# Patient Record
Sex: Male | Born: 1960 | Race: White | Hispanic: No | Marital: Married | Smoking: Former smoker
Health system: Southern US, Community
[De-identification: ages and names within clinical notes are randomized; demographics above are authoritative.]

## PROBLEM LIST (undated history)

## (undated) DIAGNOSIS — F419 Anxiety disorder, unspecified: Secondary | ICD-10-CM

## (undated) DIAGNOSIS — Z9109 Other allergy status, other than to drugs and biological substances: Secondary | ICD-10-CM

## (undated) DIAGNOSIS — E785 Hyperlipidemia, unspecified: Secondary | ICD-10-CM

## (undated) DIAGNOSIS — J45909 Unspecified asthma, uncomplicated: Secondary | ICD-10-CM

## (undated) DIAGNOSIS — M4310 Spondylolisthesis, site unspecified: Secondary | ICD-10-CM

## (undated) HISTORY — PX: FRACTURE SURGERY: SHX138

## (undated) HISTORY — DX: Hyperlipidemia, unspecified: E78.5

## (undated) HISTORY — PX: OTHER SURGICAL HISTORY: SHX169

## (undated) HISTORY — DX: Unspecified asthma, uncomplicated: J45.909

## (undated) HISTORY — PX: COLONOSCOPY: SHX174

## (undated) HISTORY — PX: WRIST SURGERY: SHX841

## (undated) HISTORY — DX: Spondylolisthesis, site unspecified: M43.10

---

## 2009-01-20 ENCOUNTER — Encounter: Admission: RE | Admit: 2009-01-20 | Discharge: 2009-01-20 | Payer: Self-pay | Admitting: Family Medicine

## 2009-03-22 ENCOUNTER — Emergency Department (HOSPITAL_COMMUNITY): Admission: EM | Admit: 2009-03-22 | Discharge: 2009-03-22 | Payer: Self-pay | Admitting: Emergency Medicine

## 2012-11-01 ENCOUNTER — Emergency Department (HOSPITAL_COMMUNITY)
Admission: EM | Admit: 2012-11-01 | Discharge: 2012-11-01 | Disposition: A | Discharge status: Home / Self Care | Payer: BC Managed Care – PPO | Attending: Emergency Medicine | Admitting: Emergency Medicine

## 2012-11-01 ENCOUNTER — Encounter (HOSPITAL_COMMUNITY): Payer: Self-pay | Admitting: Emergency Medicine

## 2012-11-01 ENCOUNTER — Emergency Department (HOSPITAL_COMMUNITY): Payer: BC Managed Care – PPO

## 2012-11-01 DIAGNOSIS — J101 Influenza due to other identified influenza virus with other respiratory manifestations: Secondary | ICD-10-CM

## 2012-11-01 DIAGNOSIS — J111 Influenza due to unidentified influenza virus with other respiratory manifestations: Secondary | ICD-10-CM | POA: Insufficient documentation

## 2012-11-01 DIAGNOSIS — Z87891 Personal history of nicotine dependence: Secondary | ICD-10-CM | POA: Insufficient documentation

## 2012-11-01 DIAGNOSIS — Z9109 Other allergy status, other than to drugs and biological substances: Secondary | ICD-10-CM | POA: Insufficient documentation

## 2012-11-01 HISTORY — DX: Other allergy status, other than to drugs and biological substances: Z91.09

## 2012-11-01 LAB — BASIC METABOLIC PANEL
Calcium: 9.2 mg/dL (ref 8.4–10.5)
GFR calc Af Amer: >90 mL/min (ref >90)
GFR calc non Af Amer: >90 mL/min (ref >90)
Potassium: 4.7 mEq/L (ref 3.5–5.1)
Sodium: 136 mEq/L (ref 135–145)

## 2012-11-01 LAB — LACTIC ACID, PLASMA: Lactic Acid, Venous: 1.8 mmol/L (ref 0.5–2.2)

## 2012-11-01 MED ORDER — ALBUTEROL SULFATE (5 MG/ML) 0.5% IN NEBU
5.0000 mg | INHALATION_SOLUTION | Freq: Once | RESPIRATORY_TRACT | Status: AC
  Administered 2012-11-01: 5 mg via RESPIRATORY_TRACT
  Filled 2012-11-01: qty 1

## 2012-11-01 MED ORDER — ALBUTEROL SULFATE HFA 108 (90 BASE) MCG/ACT IN AERS
2.0000 | INHALATION_SPRAY | RESPIRATORY_TRACT | Status: DC | PRN
  Filled 2012-11-01: qty 6.7

## 2012-11-01 MED ORDER — SODIUM CHLORIDE 0.9 % IV BOLUS (SEPSIS)
1000.0000 mL | Freq: Once | INTRAVENOUS | Status: AC
  Administered 2012-11-01: 1000 mL via INTRAVENOUS

## 2012-11-01 MED ORDER — ACETAMINOPHEN 325 MG PO TABS
650.0000 mg | ORAL_TABLET | Freq: Once | ORAL | Status: AC
  Administered 2012-11-01: 650 mg via ORAL
  Filled 2012-11-01: qty 2

## 2012-11-01 MED ORDER — METHYLPREDNISOLONE SODIUM SUCC 125 MG IJ SOLR
125.0000 mg | Freq: Once | INTRAMUSCULAR | Status: AC
  Administered 2012-11-01: 125 mg via INTRAVENOUS
  Filled 2012-11-01: qty 2

## 2012-11-01 MED ORDER — OSELTAMIVIR PHOSPHATE 75 MG PO CAPS: 75.0000 mg | ORAL_CAPSULE | Freq: Two times a day (BID) | ORAL | Status: DC

## 2012-11-01 MED ORDER — OSELTAMIVIR PHOSPHATE 75 MG PO CAPS
75.0000 mg | ORAL_CAPSULE | Freq: Once | ORAL | Status: AC
  Administered 2012-11-01: 75 mg via ORAL
  Filled 2012-11-01: qty 1

## 2012-11-01 MED ORDER — PREDNISONE 10 MG PO TABS: 20.0000 mg | ORAL_TABLET | Freq: Every day | ORAL | Status: DC

## 2012-11-01 NOTE — ED Notes (Signed)
Per EMS pt came from [place] [facility] where pt was being seen for ShOB, vomiting, shivers, diaphoretic.  Pt was 100% on RA but pt felt more comfortable with NRB on.  Pt was given DuoNeb and 12.5mg  phenergan at PCP office.

## 2012-11-01 NOTE — ED Provider Notes (Signed)
History     CSN: [id number]  Arrival date & time 11/01/12  0927   First MD Initiated Contact with Patient 11/01/12 0930      Chief Complaint  Patient presents with  . Shortness of Breath  . Fever  . Emesis    (Consider location/radiation/quality/duration/timing/severity/associated sxs/prior treatment) HPI Patient with cough, fever, dyspnea began Monday.  Patient with 34 year pack history of smoking quit smoking July 2012.  Vomited x 3.  Patient seen in pmd office this am by Dr. Luciana Axe and sent to ed.  Patient denies nasal congestion , sore throat, no joint pain.  States headache 4/10, some back pain, cough with cough drop colored sputum.  Using inhaler six times per day.  Nothing to eat or drink since 6 p.m. Yesterday.  No history of medical hospitalizations.  Subjective fever at home and chills.  Wbc 13.1 at mds office.  Past Medical History  Diagnosis Date  . Pollen allergies     Past Surgical History  Procedure Date  . Wrist surgery   . Fracture surgery   . Right index finger fracture     No family history on file.  History  Substance Use Topics  . Smoking status: Former Smoker    Quit date: 03/28/2011  . Smokeless tobacco: Not on file  . Alcohol Use: No      Review of Systems  All other systems reviewed and are negative.    Allergies  Review of patient's allergies indicates no known allergies.  Home Medications  No current outpatient prescriptions on file.  BP 126/61  Pulse 105  Temp 102.1 F (38.9 C) (Oral)  Resp 23  SpO2 100%  Physical Exam  Nursing note and vitals reviewed. Constitutional: He is oriented to person, place, and time. He appears well-developed and well-nourished.       warm  HENT:  Head: Normocephalic and atraumatic.  Right Ear: External ear normal.  Left Ear: External ear normal.  Nose: Nose normal.  Mouth/Throat: Oropharynx is clear and moist.  Eyes: Conjunctivae normal and EOM are normal. Pupils are equal, round, and  reactive to light.  Neck: Normal range of motion. Neck supple.  Cardiovascular: Regular rhythm, normal heart sounds and intact distal pulses.  Tachycardia present.   Pulmonary/Chest: Effort normal and breath sounds normal.  Abdominal: Soft. Bowel sounds are normal.  Musculoskeletal: Normal range of motion.  Neurological: He is alert and oriented to person, place, and time. He has normal reflexes.  Skin: Skin is warm and dry.  Psychiatric: He has a normal mood and affect. His behavior is normal. Thought content normal.    ED Course  Procedures (including critical care time)  Labs Reviewed - No data to display No results found.   No diagnosis found.  Dr. Ephriam Knuckles office called and report that he is positive for influenza a.    MDM  Patient hr down to 90s , sats 99% on room air.  He had one liter of ns and is drinking fluids without difficulty  He has had one neb here and has no wheezing.  He had tamiflu ordered.  He will be given rx for tamiflu, albuterol with aerochamber, and prednisone.  Patient advised return if worse, symptomatic treatment including oral hydration, and work note given until 2/10.        Hilario Quarry, MD 11/01/12 1115

## 2012-11-01 NOTE — ED Notes (Signed)
[id number]<NG> Expected date:<BR> Expected time:<BR> Means of arrival:<BR> Comments:<BR> 52 y/o M SOB

## 2012-12-18 ENCOUNTER — Other Ambulatory Visit: Payer: Self-pay | Admitting: Family Medicine

## 2012-12-18 DIAGNOSIS — R609 Edema, unspecified: Secondary | ICD-10-CM

## 2012-12-22 ENCOUNTER — Ambulatory Visit
Admission: RE | Admit: 2012-12-22 | Discharge: 2012-12-22 | Disposition: A | Discharge status: Home / Self Care | Payer: BC Managed Care – PPO | Source: Ambulatory Visit | Attending: Family Medicine | Admitting: Family Medicine

## 2012-12-22 DIAGNOSIS — R609 Edema, unspecified: Secondary | ICD-10-CM

## 2013-05-21 ENCOUNTER — Other Ambulatory Visit: Payer: Self-pay | Admitting: Family Medicine

## 2013-05-21 DIAGNOSIS — M549 Dorsalgia, unspecified: Secondary | ICD-10-CM

## 2013-05-30 ENCOUNTER — Other Ambulatory Visit: Payer: Self-pay | Admitting: Family Medicine

## 2013-05-30 DIAGNOSIS — M549 Dorsalgia, unspecified: Secondary | ICD-10-CM

## 2013-06-01 ENCOUNTER — Ambulatory Visit
Admission: RE | Admit: 2013-06-01 | Discharge: 2013-06-01 | Disposition: A | Discharge status: Home / Self Care | Payer: BC Managed Care – PPO | Source: Ambulatory Visit | Attending: Family Medicine | Admitting: Family Medicine

## 2013-06-01 DIAGNOSIS — M549 Dorsalgia, unspecified: Secondary | ICD-10-CM

## 2013-06-03 ENCOUNTER — Ambulatory Visit
Admission: RE | Admit: 2013-06-03 | Discharge: 2013-06-03 | Disposition: A | Discharge status: Home / Self Care | Payer: BC Managed Care – PPO | Source: Ambulatory Visit | Attending: Family Medicine | Admitting: Family Medicine

## 2013-06-03 DIAGNOSIS — M549 Dorsalgia, unspecified: Secondary | ICD-10-CM

## 2013-07-25 ENCOUNTER — Other Ambulatory Visit: Payer: Self-pay | Admitting: Neurosurgery

## 2013-07-25 DIAGNOSIS — M5412 Radiculopathy, cervical region: Secondary | ICD-10-CM

## 2013-08-05 ENCOUNTER — Ambulatory Visit
Admission: RE | Admit: 2013-08-05 | Discharge: 2013-08-05 | Disposition: A | Discharge status: Home / Self Care | Payer: BC Managed Care – PPO | Source: Ambulatory Visit | Attending: Neurosurgery | Admitting: Neurosurgery

## 2013-08-05 DIAGNOSIS — M5412 Radiculopathy, cervical region: Secondary | ICD-10-CM

## 2014-01-18 IMAGING — CR DG CHEST 2V
2 series · 2 of 2 positions shown · non-contrast
Comparison: CT 01/20/2009

CLINICAL DATA: Cough and fever

CHEST - 2 VIEW

[w chest pa]
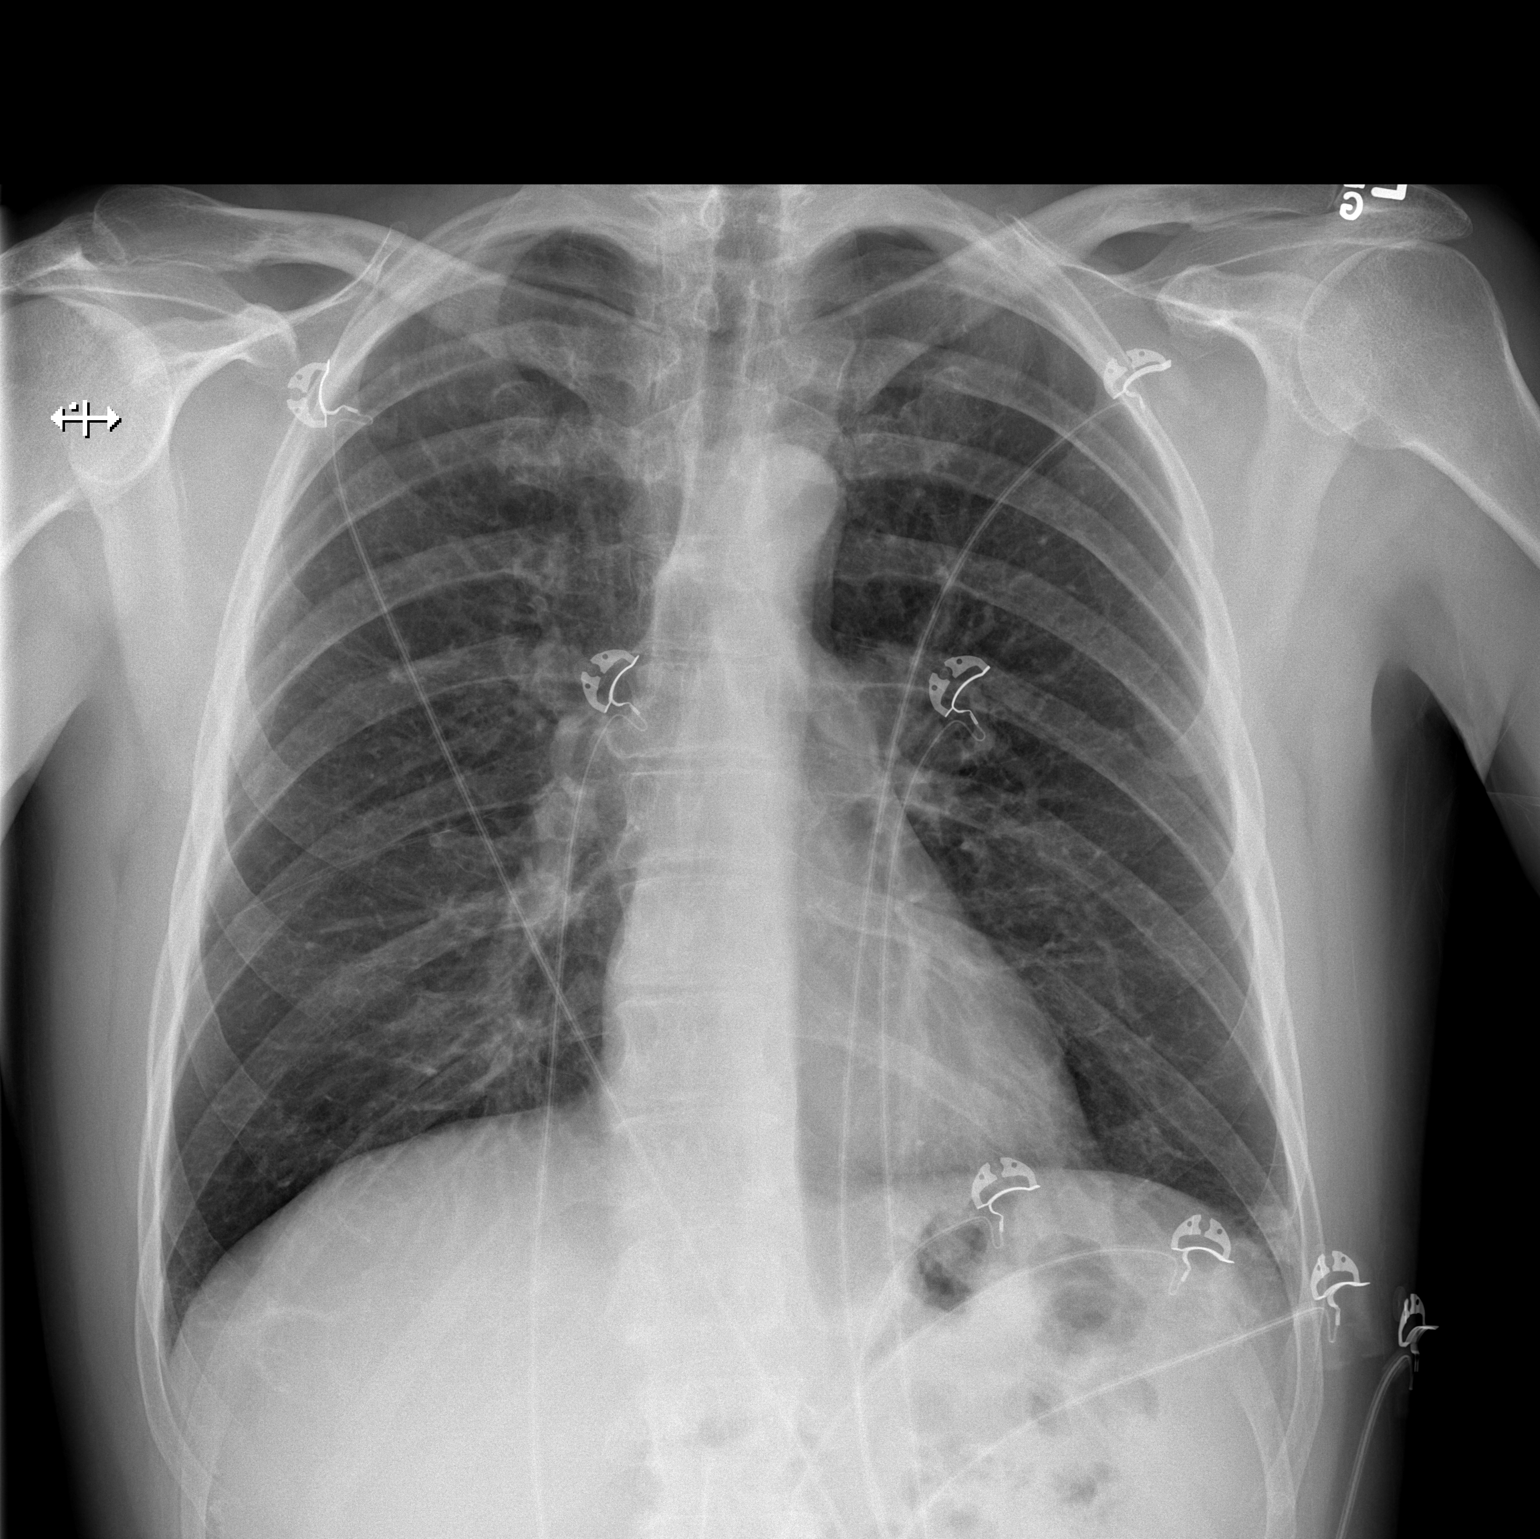

[w chest lat]
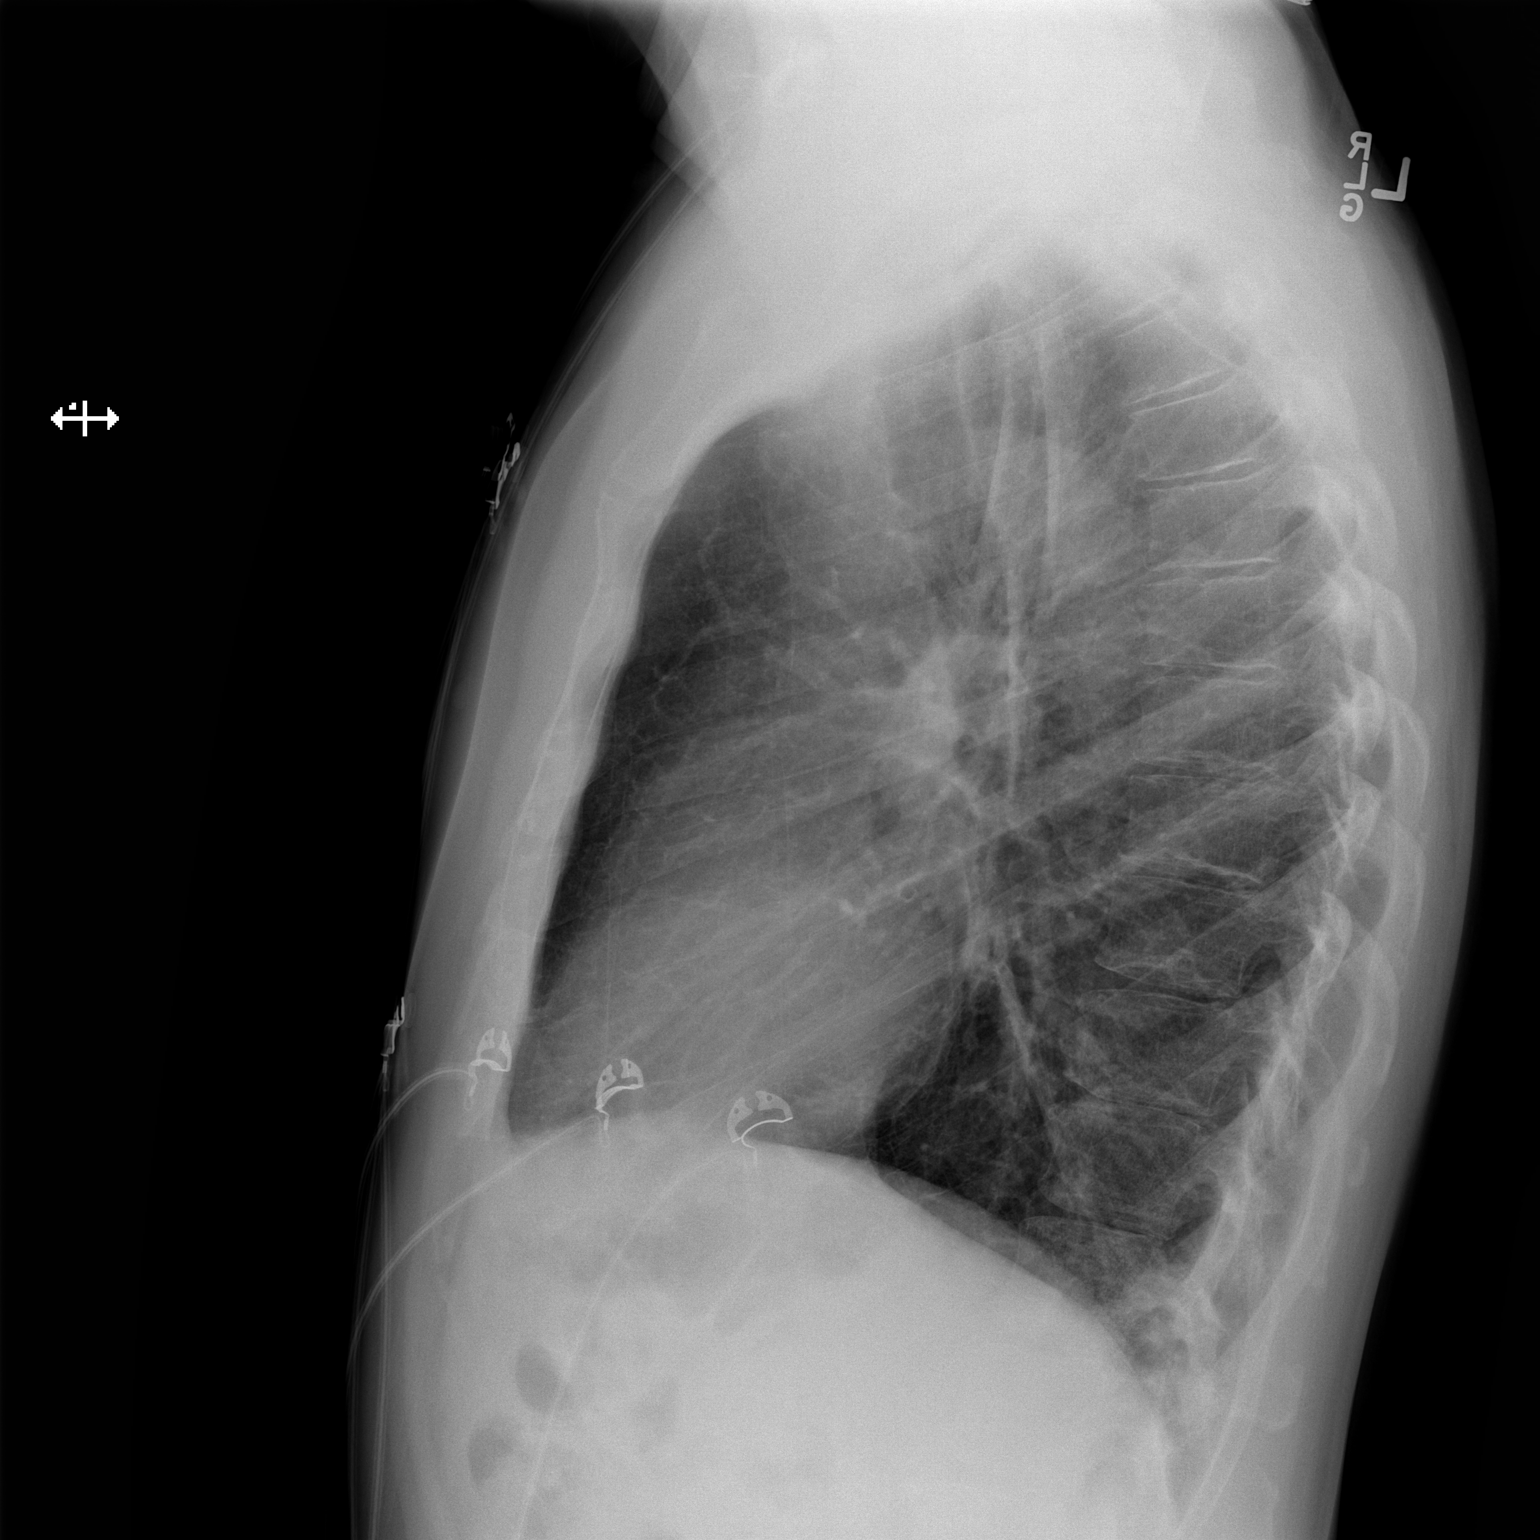

[2 of 2 positions shown; findings below may reference images not displayed]

FINDINGS: Heart size and vascularity are normal.  Lungs are clear
without infiltrate or effusion.  Negative for heart failure or
pneumonia.  No mass or adenopathy.
IMPRESSION: No acute abnormality.

## 2014-12-31 ENCOUNTER — Other Ambulatory Visit: Payer: Self-pay | Admitting: Neurosurgery

## 2014-12-31 DIAGNOSIS — M4310 Spondylolisthesis, site unspecified: Secondary | ICD-10-CM

## 2015-01-06 ENCOUNTER — Ambulatory Visit
Admission: RE | Admit: 2015-01-06 | Discharge: 2015-01-06 | Disposition: A | Discharge status: Home / Self Care | Payer: BLUE CROSS/BLUE SHIELD | Source: Ambulatory Visit | Attending: Neurosurgery | Admitting: Neurosurgery

## 2015-01-06 DIAGNOSIS — M4310 Spondylolisthesis, site unspecified: Secondary | ICD-10-CM

## 2015-01-10 ENCOUNTER — Other Ambulatory Visit: Payer: Self-pay | Admitting: Neurosurgery

## 2015-01-21 ENCOUNTER — Other Ambulatory Visit: Payer: Self-pay

## 2015-01-22 ENCOUNTER — Encounter (HOSPITAL_COMMUNITY): Payer: Self-pay

## 2015-01-22 ENCOUNTER — Encounter (HOSPITAL_COMMUNITY)
Admission: RE | Admit: 2015-01-22 | Discharge: 2015-01-22 | Disposition: A | Discharge status: Home / Self Care | Payer: BLUE CROSS/BLUE SHIELD | Source: Ambulatory Visit | Attending: Neurosurgery | Admitting: Neurosurgery

## 2015-01-22 HISTORY — DX: Anxiety disorder, unspecified: F41.9

## 2015-01-22 LAB — SURGICAL PCR SCREEN
MRSA, PCR: NEGATIVE
STAPHYLOCOCCUS AUREUS: POSITIVE — AB

## 2015-01-22 LAB — CBC
HCT: 40.3 % (ref 39.0–52.0)
HEMOGLOBIN: 13.4 g/dL (ref 13.0–17.0)
MCH: 29.1 pg (ref 26.0–34.0)
MCHC: 33.3 g/dL (ref 30.0–36.0)
MCV: 87.4 fL (ref 78.0–100.0)
PLATELETS: 240 10*3/uL (ref 150–400)
RBC: 4.61 MIL/uL (ref 4.22–5.81)
RDW: 12.7 % (ref 11.5–15.5)
WBC: 7.5 10*3/uL (ref 4.0–10.5)

## 2015-01-22 NOTE — Progress Notes (Signed)
Nurse [facility], and Nurse called patient and informed him of positive PCR results.

## 2015-01-22 NOTE — Progress Notes (Signed)
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 Therefore T&S will be collected DOS.

## 2015-01-22 NOTE — Pre-Procedure Instructions (Signed)
Christopher CapriceGregory A Horn  01/22/2015   Your procedure is scheduled on:  Friday Jan 31, 2015 at 9:57 AM.  [facility] 7:00 AM.  Call this number if you have problems the morning of surgery: [phone]   Remember:   Do not eat food or drink liquids after midnight.   Take these medicines the morning of surgery with A SIP OF WATER: Pain medication   Do not wear jewelry.  Do not wear lotions, powders, or cologne.   Men may shave face and neck.  Do not bring valuables to the hospital.  [facility] is not responsible for any belongings or valuables.               Contacts, dentures or bridgework may not be worn into surgery.  Leave suitcase in the car. After surgery it may be brought to your room.  For patients admitted to the hospital, discharge time is determined by your treatment team.               Patients discharged the day of surgery will not be allowed to drive home.  Name and phone number of your driver:   Special Instructions: Shower using CHG soap the night before and the morning of your surgery   Please read over the following fact sheets that you were given: Pain Booklet, Coughing and Deep Breathing, Blood Transfusion Information, MRSA Information and Surgical Site Infection Prevention

## 2015-01-26 HISTORY — PX: BACK SURGERY: SHX140

## 2015-01-30 MED ORDER — CEFAZOLIN SODIUM-DEXTROSE 2-3 GM-% IV SOLR
2.0000 g | INTRAVENOUS | Status: AC
  Administered 2015-01-31: 2 g via INTRAVENOUS
  Filled 2015-01-30: qty 50

## 2015-01-30 NOTE — H&P (Signed)
Christopher CapriceGregory A Horn is an 54 y.o. male.   Chief Complaint: right leg pain HPI: patient complaining of lumbar pain with radiation to the right foot associated with sensory changes ,unable to sleep and worse now because he is working at two different places. On top of the he also has neck pain with radiation to the left foot. Lately the numbnes is in both feet.   Past Medical History  Diagnosis Date  . Pollen allergies   . Anxiety     Past Surgical History  Procedure Laterality Date  . Wrist surgery    . Fracture surgery    . Right index finger fracture    . Colonoscopy      No family history on file. Social History:  reports that he quit smoking about 3 years ago. He does not have any smokeless tobacco history on file. He reports that he drinks alcohol. He reports that he does not use illicit drugs.  Allergies: No Known Allergies  No prescriptions prior to admission    No results found for this or any previous visit (from the past 48 hour(s)). No results found.  Review of Systems  Constitutional: Negative.   HENT: Negative.   Eyes: Negative.   Respiratory: Negative.   Cardiovascular: Negative.   Gastrointestinal: Negative.   Genitourinary: Negative.   Musculoskeletal: Positive for back pain.  Skin: Negative.   Neurological: Positive for focal weakness.  Endo/Heme/Allergies: Negative.   Psychiatric/Behavioral: Negative.     There were no vitals taken for this visit. Physical Exam hent, nl. Neck, pain with lateralization. Cv, nl. Lungs,clear. Abdomen, soft. Extremities, nl. NEURO decrease of sensation in both s1 dermatomes. Weakness of DF both feet. SLR positive at 60 degrees. Difficulties walking in both tiptoes and heels. Mri shows grade 2 spondylolisthesis at l5-s1  Assessment/Plan Patient to go ahead wih decompression and fusion at l5s1 with cages and screws. He is aware of risks and benefits. Late will do a w/u of the cervical spine  Christopher Horn 01/30/2015, 6:06  PM

## 2015-01-31 ENCOUNTER — Inpatient Hospital Stay (HOSPITAL_COMMUNITY): Payer: BLUE CROSS/BLUE SHIELD

## 2015-01-31 ENCOUNTER — Inpatient Hospital Stay (HOSPITAL_COMMUNITY): Payer: BLUE CROSS/BLUE SHIELD | Admitting: Anesthesiology

## 2015-01-31 ENCOUNTER — Encounter (HOSPITAL_COMMUNITY)
Admission: RE | Disposition: A | Discharge status: Home / Self Care | Payer: BLUE CROSS/BLUE SHIELD | Source: Ambulatory Visit | Attending: Neurosurgery

## 2015-01-31 ENCOUNTER — Encounter (HOSPITAL_COMMUNITY): Payer: Self-pay | Admitting: Anesthesiology

## 2015-01-31 ENCOUNTER — Inpatient Hospital Stay (HOSPITAL_COMMUNITY)
Admission: RE | Admit: 2015-01-31 | Discharge: 2015-02-04 | DRG: 460 | Disposition: A | Discharge status: Home / Self Care | Payer: BLUE CROSS/BLUE SHIELD | Source: Ambulatory Visit | Attending: Neurosurgery | Admitting: Neurosurgery

## 2015-01-31 DIAGNOSIS — M4316 Spondylolisthesis, lumbar region: Secondary | ICD-10-CM | POA: Diagnosis present

## 2015-01-31 DIAGNOSIS — K5900 Constipation, unspecified: Secondary | ICD-10-CM | POA: Diagnosis not present

## 2015-01-31 DIAGNOSIS — M5416 Radiculopathy, lumbar region: Secondary | ICD-10-CM | POA: Diagnosis present

## 2015-01-31 DIAGNOSIS — Z419 Encounter for procedure for purposes other than remedying health state, unspecified: Secondary | ICD-10-CM

## 2015-01-31 DIAGNOSIS — Z87891 Personal history of nicotine dependence: Secondary | ICD-10-CM

## 2015-01-31 DIAGNOSIS — Z01812 Encounter for preprocedural laboratory examination: Secondary | ICD-10-CM | POA: Diagnosis not present

## 2015-01-31 LAB — TYPE AND SCREEN
ABO/RH(D): O NEG
Antibody Screen: NEGATIVE

## 2015-01-31 LAB — ABO/RH: ABO/RH(D): O NEG

## 2015-01-31 SURGERY — POSTERIOR LUMBAR FUSION 1 LEVEL
Anesthesia: General

## 2015-01-31 MED ORDER — ONDANSETRON HCL 4 MG/2ML IJ SOLN
INTRAMUSCULAR | Status: DC | PRN
  Administered 2015-01-31: 4 mg via INTRAVENOUS

## 2015-01-31 MED ORDER — PROPOFOL 10 MG/ML IV BOLUS
INTRAVENOUS | Status: DC | PRN
  Administered 2015-01-31: 260 mg via INTRAVENOUS

## 2015-01-31 MED ORDER — PROPOFOL 10 MG/ML IV BOLUS
INTRAVENOUS | Status: AC
  Filled 2015-01-31: qty 20

## 2015-01-31 MED ORDER — DIAZEPAM 5 MG PO TABS
5.0000 mg | ORAL_TABLET | Freq: Four times a day (QID) | ORAL | Status: DC | PRN
  Administered 2015-02-03 (×2): 5 mg via ORAL
  Filled 2015-01-31 (×2): qty 1

## 2015-01-31 MED ORDER — ROCURONIUM BROMIDE 100 MG/10ML IV SOLN
INTRAVENOUS | Status: DC | PRN
  Administered 2015-01-31: 50 mg via INTRAVENOUS

## 2015-01-31 MED ORDER — ONDANSETRON HCL 4 MG/2ML IJ SOLN: 4.0000 mg | Freq: Four times a day (QID) | INTRAMUSCULAR | Status: DC | PRN

## 2015-01-31 MED ORDER — FENTANYL CITRATE (PF) 100 MCG/2ML IJ SOLN
INTRAMUSCULAR | Status: DC | PRN
  Administered 2015-01-31 (×2): 100 ug via INTRAVENOUS
  Administered 2015-01-31: 50 ug via INTRAVENOUS

## 2015-01-31 MED ORDER — VANCOMYCIN HCL 1000 MG IV SOLR
INTRAVENOUS | Status: AC
  Filled 2015-01-31: qty 1000

## 2015-01-31 MED ORDER — MIDAZOLAM HCL 2 MG/2ML IJ SOLN
INTRAMUSCULAR | Status: AC
  Filled 2015-01-31: qty 2

## 2015-01-31 MED ORDER — MENTHOL 3 MG MT LOZG: 1.0000 | LOZENGE | OROMUCOSAL | Status: DC | PRN

## 2015-01-31 MED ORDER — ROCURONIUM BROMIDE 50 MG/5ML IV SOLN
INTRAVENOUS | Status: AC
  Filled 2015-01-31: qty 1

## 2015-01-31 MED ORDER — ONDANSETRON HCL 4 MG/2ML IJ SOLN
INTRAMUSCULAR | Status: AC
  Filled 2015-01-31: qty 2

## 2015-01-31 MED ORDER — ONDANSETRON HCL 4 MG/2ML IJ SOLN
4.0000 mg | INTRAMUSCULAR | Status: DC | PRN
  Administered 2015-02-01 – 2015-02-04 (×3): 4 mg via INTRAVENOUS
  Filled 2015-01-31 (×3): qty 2

## 2015-01-31 MED ORDER — DIPHENHYDRAMINE HCL 12.5 MG/5ML PO ELIX: 12.5000 mg | ORAL_SOLUTION | Freq: Four times a day (QID) | ORAL | Status: DC | PRN

## 2015-01-31 MED ORDER — LACTATED RINGERS IV SOLN
INTRAVENOUS | Status: DC | PRN
  Administered 2015-01-31 (×2): via INTRAVENOUS

## 2015-01-31 MED ORDER — SODIUM CHLORIDE 0.9 % IJ SOLN: 9.0000 mL | INTRAMUSCULAR | Status: DC | PRN

## 2015-01-31 MED ORDER — SODIUM CHLORIDE 0.9 % IV SOLN
250.0000 mL | INTRAVENOUS | Status: DC
Start: 2015-01-31 — End: 2015-02-04

## 2015-01-31 MED ORDER — ARTIFICIAL TEARS OP OINT
TOPICAL_OINTMENT | OPHTHALMIC | Status: AC
  Filled 2015-01-31: qty 3.5

## 2015-01-31 MED ORDER — 0.9 % SODIUM CHLORIDE (POUR BTL) OPTIME
TOPICAL | Status: DC | PRN
  Administered 2015-01-31: 1000 mL

## 2015-01-31 MED ORDER — FENTANYL CITRATE (PF) 250 MCG/5ML IJ SOLN
INTRAMUSCULAR | Status: AC
  Filled 2015-01-31: qty 5

## 2015-01-31 MED ORDER — PROMETHAZINE HCL 25 MG/ML IJ SOLN
6.2500 mg | Freq: Once | INTRAMUSCULAR | Status: AC
  Administered 2015-01-31: 12.5 mg via INTRAVENOUS
  Filled 2015-01-31: qty 1

## 2015-01-31 MED ORDER — SUFENTANIL CITRATE 50 MCG/ML IV SOLN
INTRAVENOUS | Status: AC
  Filled 2015-01-31: qty 1

## 2015-01-31 MED ORDER — ACETAMINOPHEN 650 MG RE SUPP: 650.0000 mg | RECTAL | Status: DC | PRN

## 2015-01-31 MED ORDER — LACTATED RINGERS IV SOLN
Freq: Once | INTRAVENOUS | Status: AC
  Administered 2015-01-31: 10:00:00 via INTRAVENOUS

## 2015-01-31 MED ORDER — VANCOMYCIN HCL 1000 MG IV SOLR
INTRAVENOUS | Status: DC | PRN
  Administered 2015-01-31 (×2): 1000 mg

## 2015-01-31 MED ORDER — SENNA 8.6 MG PO TABS
1.0000 | ORAL_TABLET | Freq: Two times a day (BID) | ORAL | Status: DC
  Administered 2015-01-31 – 2015-02-04 (×8): 8.6 mg via ORAL
  Filled 2015-01-31 (×8): qty 1

## 2015-01-31 MED ORDER — ACETAMINOPHEN 325 MG PO TABS: 650.0000 mg | ORAL_TABLET | ORAL | Status: DC | PRN

## 2015-01-31 MED ORDER — OXYCODONE-ACETAMINOPHEN 5-325 MG PO TABS
1.0000 | ORAL_TABLET | ORAL | Status: DC | PRN
  Administered 2015-02-01 – 2015-02-04 (×18): 2 via ORAL
  Filled 2015-01-31 (×18): qty 2

## 2015-01-31 MED ORDER — NALOXONE HCL 0.4 MG/ML IJ SOLN: 0.4000 mg | INTRAMUSCULAR | Status: DC | PRN

## 2015-01-31 MED ORDER — CEFAZOLIN SODIUM 1-5 GM-% IV SOLN
1.0000 g | Freq: Three times a day (TID) | INTRAVENOUS | Status: AC
  Administered 2015-01-31 (×2): 1 g via INTRAVENOUS
  Filled 2015-01-31 (×2): qty 50

## 2015-01-31 MED ORDER — ZOLPIDEM TARTRATE 5 MG PO TABS: 10.0000 mg | ORAL_TABLET | Freq: Every evening | ORAL | Status: DC | PRN

## 2015-01-31 MED ORDER — SODIUM CHLORIDE 0.9 % IJ SOLN
3.0000 mL | Freq: Two times a day (BID) | INTRAMUSCULAR | Status: DC
  Administered 2015-01-31 – 2015-02-04 (×4): 3 mL via INTRAVENOUS

## 2015-01-31 MED ORDER — MORPHINE SULFATE (PF) 1 MG/ML IV SOLN
INTRAVENOUS | Status: AC
  Filled 2015-01-31: qty 25

## 2015-01-31 MED ORDER — BUPIVACAINE LIPOSOME 1.3 % IJ SUSP
INTRAMUSCULAR | Status: DC | PRN
  Administered 2015-01-31: 20 mL

## 2015-01-31 MED ORDER — MIDAZOLAM HCL 5 MG/5ML IJ SOLN
INTRAMUSCULAR | Status: DC | PRN
  Administered 2015-01-31: 2 mg via INTRAVENOUS

## 2015-01-31 MED ORDER — NEOSTIGMINE METHYLSULFATE 10 MG/10ML IV SOLN
INTRAVENOUS | Status: DC | PRN
  Administered 2015-01-31: 4 mg via INTRAVENOUS

## 2015-01-31 MED ORDER — SODIUM CHLORIDE 0.9 % IV SOLN
INTRAVENOUS | Status: DC
  Administered 2015-01-31 – 2015-02-02 (×2): via INTRAVENOUS

## 2015-01-31 MED ORDER — ONDANSETRON HCL 4 MG/2ML IJ SOLN
4.0000 mg | Freq: Once | INTRAMUSCULAR | Status: AC | PRN
  Administered 2015-01-31: 4 mg via INTRAVENOUS

## 2015-01-31 MED ORDER — SODIUM CHLORIDE 0.9 % IJ SOLN: 3.0000 mL | INTRAMUSCULAR | Status: DC | PRN

## 2015-01-31 MED ORDER — HYDROMORPHONE HCL 1 MG/ML IJ SOLN
INTRAMUSCULAR | Status: AC
  Filled 2015-01-31: qty 1

## 2015-01-31 MED ORDER — THROMBIN 20000 UNITS EX SOLR
CUTANEOUS | Status: DC | PRN
  Administered 2015-01-31: 10:00:00 via TOPICAL

## 2015-01-31 MED ORDER — EPHEDRINE SULFATE 50 MG/ML IJ SOLN
INTRAMUSCULAR | Status: DC | PRN
  Administered 2015-01-31 (×2): 20 mg via INTRAVENOUS

## 2015-01-31 MED ORDER — LIDOCAINE HCL (CARDIAC) 20 MG/ML IV SOLN
INTRAVENOUS | Status: DC | PRN
Start: 2015-01-31 — End: 2015-01-31
  Administered 2015-01-31: 80 mg via INTRAVENOUS
  Administered 2015-01-31: 60 mg via INTRATRACHEAL

## 2015-01-31 MED ORDER — FLUMAZENIL 0.5 MG/5ML IV SOLN
INTRAVENOUS | Status: AC
  Administered 2015-01-31: .4 mg via INTRAVENOUS
  Filled 2015-01-31: qty 5

## 2015-01-31 MED ORDER — PHENYLEPHRINE HCL 10 MG/ML IJ SOLN
INTRAMUSCULAR | Status: DC | PRN
  Administered 2015-01-31: 80 ug via INTRAVENOUS

## 2015-01-31 MED ORDER — BUPIVACAINE LIPOSOME 1.3 % IJ SUSP
20.0000 mL | Freq: Once | INTRAMUSCULAR | Status: DC
  Filled 2015-01-31: qty 20

## 2015-01-31 MED ORDER — HYDROMORPHONE HCL 1 MG/ML IJ SOLN
0.5000 mg | INTRAMUSCULAR | Status: AC | PRN
  Administered 2015-01-31 (×4): 0.5 mg via INTRAVENOUS

## 2015-01-31 MED ORDER — DIPHENHYDRAMINE HCL 50 MG/ML IJ SOLN: 12.5000 mg | Freq: Four times a day (QID) | INTRAMUSCULAR | Status: DC | PRN

## 2015-01-31 MED ORDER — THROMBIN 5000 UNITS EX SOLR
OROMUCOSAL | Status: DC | PRN
  Administered 2015-01-31: 10:00:00 via TOPICAL

## 2015-01-31 MED ORDER — MORPHINE SULFATE (PF) 1 MG/ML IV SOLN
INTRAVENOUS | Status: DC
  Administered 2015-01-31: 4.5 mg via INTRAVENOUS
  Administered 2015-01-31 – 2015-02-01 (×2): via INTRAVENOUS
  Administered 2015-02-01: 19.5 mg via INTRAVENOUS
  Administered 2015-02-01: 25.5 mg via INTRAVENOUS
  Administered 2015-02-01 – 2015-02-02 (×2): via INTRAVENOUS
  Administered 2015-02-02: 10.5 mg via INTRAVENOUS
  Administered 2015-02-02: 22.5 mg via INTRAVENOUS
  Filled 2015-01-31 (×4): qty 25

## 2015-01-31 MED ORDER — GLYCOPYRROLATE 0.2 MG/ML IJ SOLN
INTRAMUSCULAR | Status: DC | PRN
  Administered 2015-01-31: 0.2 mg via INTRAVENOUS
  Administered 2015-01-31: 0.6 mg via INTRAVENOUS

## 2015-01-31 MED ORDER — PHENOL 1.4 % MT LIQD: 1.0000 | OROMUCOSAL | Status: DC | PRN

## 2015-01-31 MED FILL — Sodium Chloride IV Soln 0.9%: INTRAVENOUS | Qty: 1000 | Status: AC

## 2015-01-31 MED FILL — Heparin Sodium (Porcine) Inj 1000 Unit/ML: INTRAMUSCULAR | Qty: 30 | Status: AC

## 2015-01-31 SURGICAL SUPPLY — 70 items
APL SKNCLS STERI-STRIP NONHPOA (GAUZE/BANDAGES/DRESSINGS) ×1
BENZOIN TINCTURE PRP APPL 2/3 (GAUZE/BANDAGES/DRESSINGS) ×2 IMPLANT
BLADE CLIPPER SURG (BLADE) IMPLANT
BUR ACORN 6.0 (BURR) ×2 IMPLANT
BUR MATCHSTICK NEURO 3.0 LAGG (BURR) ×2 IMPLANT
CANISTER SUCT 3000ML PPV (MISCELLANEOUS) ×2 IMPLANT
CAP LOCKING THREADED (Cap) ×4 IMPLANT
CONT SPEC 4OZ CLIKSEAL STRL BL (MISCELLANEOUS) ×2 IMPLANT
COVER BACK TABLE 60X90IN (DRAPES) ×2 IMPLANT
CROSSLINK SPINAL 38-50MM (Neuro Prosthesis/Implant) ×1 IMPLANT
DRAPE C-ARM 42X72 X-RAY (DRAPES) ×4 IMPLANT
DRAPE LAPAROTOMY 100X72X124 (DRAPES) ×2 IMPLANT
DRAPE POUCH INSTRU U-SHP 10X18 (DRAPES) ×2 IMPLANT
DRAPE PROXIMA HALF (DRAPES) ×2 IMPLANT
DRSG OPSITE POSTOP 4X6 (GAUZE/BANDAGES/DRESSINGS) ×1 IMPLANT
DRSG PAD ABDOMINAL 8X10 ST (GAUZE/BANDAGES/DRESSINGS) IMPLANT
DURAPREP 26ML APPLICATOR (WOUND CARE) ×2 IMPLANT
ELECT REM PT RETURN 9FT ADLT (ELECTROSURGICAL) ×2
ELECTRODE REM PT RTRN 9FT ADLT (ELECTROSURGICAL) ×1 IMPLANT
EVACUATOR 1/8 PVC DRAIN (DRAIN) IMPLANT
GAUZE SPONGE 4X4 12PLY STRL (GAUZE/BANDAGES/DRESSINGS) ×2 IMPLANT
GAUZE SPONGE 4X4 16PLY XRAY LF (GAUZE/BANDAGES/DRESSINGS) ×2 IMPLANT
GLOVE BIO SURGEON STRL SZ 6.5 (GLOVE) ×2 IMPLANT
GLOVE BIO SURGEON STRL SZ8 (GLOVE) ×1 IMPLANT
GLOVE BIO SURGEON STRL SZ8.5 (GLOVE) ×1 IMPLANT
GLOVE BIOGEL M 8.0 STRL (GLOVE) ×2 IMPLANT
GLOVE ECLIPSE 7.5 STRL STRAW (GLOVE) ×2 IMPLANT
GLOVE EXAM NITRILE LRG STRL (GLOVE) IMPLANT
GLOVE EXAM NITRILE MD LF STRL (GLOVE) IMPLANT
GLOVE EXAM NITRILE XL STR (GLOVE) IMPLANT
GLOVE EXAM NITRILE XS STR PU (GLOVE) IMPLANT
GLOVE INDICATOR 7.0 STRL GRN (GLOVE) ×1 IMPLANT
GLOVE INDICATOR 7.5 STRL GRN (GLOVE) ×2 IMPLANT
GOWN STRL REUS W/ TWL LRG LVL3 (GOWN DISPOSABLE) ×1 IMPLANT
GOWN STRL REUS W/ TWL XL LVL3 (GOWN DISPOSABLE) IMPLANT
GOWN STRL REUS W/TWL 2XL LVL3 (GOWN DISPOSABLE) IMPLANT
GOWN STRL REUS W/TWL LRG LVL3 (GOWN DISPOSABLE) ×2
GOWN STRL REUS W/TWL XL LVL3 (GOWN DISPOSABLE) ×4
HEMOSTAT POWDER SURGIFOAM 1G (HEMOSTASIS) ×2 IMPLANT
KIT BASIN OR (CUSTOM PROCEDURE TRAY) ×2 IMPLANT
KIT INFUSE MEDIUM (Orthopedic Implant) ×1 IMPLANT
KIT ROOM TURNOVER OR (KITS) ×2 IMPLANT
MILL MEDIUM DISP (BLADE) ×1 IMPLANT
NDL HYPO 25X1 1.5 SAFETY (NEEDLE) IMPLANT
NEEDLE HYPO 18GX1.5 BLUNT FILL (NEEDLE) IMPLANT
NEEDLE HYPO 21X1.5 SAFETY (NEEDLE) ×2 IMPLANT
NEEDLE HYPO 25X1 1.5 SAFETY (NEEDLE) ×2 IMPLANT
NS IRRIG 1000ML POUR BTL (IV SOLUTION) ×2 IMPLANT
PACK LAMINECTOMY NEURO (CUSTOM PROCEDURE TRAY) ×2 IMPLANT
PAD ARMBOARD 7.5X6 YLW CONV (MISCELLANEOUS) ×6 IMPLANT
PATTIES SURGICAL .5 X1 (DISPOSABLE) ×2 IMPLANT
PATTIES SURGICAL .5 X3 (DISPOSABLE) IMPLANT
ROD 40MM SPINAL (Rod) ×2 IMPLANT
SCREW 6.5X45 (Screw) ×2 IMPLANT
SCREW SPINE CREO 6.5X50 (Screw) ×4 IMPLANT
SPONGE LAP 4X18 X RAY DECT (DISPOSABLE) IMPLANT
SPONGE NEURO XRAY DETECT 1X3 (DISPOSABLE) IMPLANT
SPONGE SURGIFOAM ABS GEL 100 (HEMOSTASIS) ×2 IMPLANT
STRIP CLOSURE SKIN 1/2X4 (GAUZE/BANDAGES/DRESSINGS) ×2 IMPLANT
SUT VIC AB 1 CT1 18XBRD ANBCTR (SUTURE) ×2 IMPLANT
SUT VIC AB 1 CT1 8-18 (SUTURE) ×4
SUT VIC AB 2-0 CP2 18 (SUTURE) ×3 IMPLANT
SUT VIC AB 3-0 SH 8-18 (SUTURE) ×2 IMPLANT
SYR 20CC LL (SYRINGE) IMPLANT
SYR 20ML ECCENTRIC (SYRINGE) ×2 IMPLANT
SYR 5ML LL (SYRINGE) IMPLANT
TOWEL OR 17X24 6PK STRL BLUE (TOWEL DISPOSABLE) ×2 IMPLANT
TOWEL OR 17X26 10 PK STRL BLUE (TOWEL DISPOSABLE) ×2 IMPLANT
TRAY FOLEY CATH 14FRSI W/METER (CATHETERS) ×2 IMPLANT
WATER STERILE IRR 1000ML POUR (IV SOLUTION) ×2 IMPLANT

## 2015-01-31 NOTE — Anesthesia Postprocedure Evaluation (Signed)
  Anesthesia Post-op Note  Patient: Christopher Horn  Procedure(s) Performed: Procedure(s): Lumbar five sacral one Posterior lumbar fusion with instrumentation and no interbody (N/A)  Patient Location: PACU  Anesthesia Type:General  Level of Consciousness: awake, alert , oriented and patient cooperative  Airway and Oxygen Therapy: Patient Spontanous Breathing  Post-op Pain: mild  Post-op Assessment: Post-op Vital signs reviewed, Patient's Cardiovascular Status Stable, Respiratory Function Stable, Patent Airway, No signs of Nausea or vomiting and Pain level controlled  Post-op Vital Signs: stable  Last Vitals:  Filed Vitals:   01/31/15 1454  BP: 134/71  Pulse: 80  Temp: 36.4 C  Resp: 16    Complications: No apparent anesthesia complications

## 2015-01-31 NOTE — Progress Notes (Signed)
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 Patient ID: Christopher Horn Cariker, male   DOB: 06/17/1961, 54 y.o.   MRN: [id number]   Jennye MoccasinHughes, Kellyn Mccary Craig 01/31/2015, 3:35 PM

## 2015-01-31 NOTE — Anesthesia Procedure Notes (Signed)
Procedure Name: Intubation Date/Time: 01/31/2015 10:17 AM Performed by: [place] Pink Pre-anesthesia Checklist: Patient identified, Emergency Drugs available, Suction available, Patient being monitored and Timeout performed Patient Re-evaluated:Patient Re-evaluated prior to inductionOxygen Delivery Method: Circle system utilized Preoxygenation: Pre-oxygenation with 100% oxygen Intubation Type: IV induction Ventilation: Mask ventilation without difficulty Laryngoscope Size: Mac and 4 Grade View: Grade I Tube type: Oral Tube size: 7.5 mm Number of attempts: 1 Airway Equipment and Method: Stylet and LTA kit utilized Placement Confirmation: ETT inserted through vocal cords under direct vision,  positive ETCO2 and breath sounds checked- equal and bilateral Secured at: 22 cm Tube secured with: Tape Dental Injury: Teeth and Oropharynx as per pre-operative assessment

## 2015-01-31 NOTE — Transfer of Care (Signed)
Immediate Anesthesia Transfer of Care Note  Patient: Christopher Horn  Procedure(s) Performed: Procedure(s): Lumbar five sacral one Posterior lumbar fusion with instrumentation and no interbody (N/A)  Patient Location: PACU  Anesthesia Type:General  Level of Consciousness: awake and alert   Airway & Oxygen Therapy: Patient Spontanous Breathing and Patient connected to nasal cannula oxygen  Post-op Assessment: Report given to RN and Post -op Vital signs reviewed and stable  Post vital signs: Reviewed and stable  Last Vitals:  Filed Vitals:   01/31/15 1325  BP:   Pulse:   Temp: 36.2 C  Resp:     Complications: No apparent anesthesia complications

## 2015-01-31 NOTE — Progress Notes (Signed)
Orthopedic Tech Progress Note Patient Details:  Christopher Horn, Christopher Horn [id number]  Patient ID: Christopher Horn, male   DOB: Dec Horn, Christopher Horn, 54 y.o.   MRN: [id number] Called in bio-tech brace order; spoke with Richardean Chimeraathy  Cypher Paule 01/31/2015, 3:02 PM

## 2015-01-31 NOTE — Anesthesia Preprocedure Evaluation (Signed)
Anesthesia Evaluation  Patient identified by MRN, date of birth, ID band Patient awake    Reviewed: Allergy & Precautions, NPO status , Patient's Chart, lab work & pertinent test results  Airway Mallampati: I  TM Distance: >3 FB     Dental   Pulmonary former smoker,    Pulmonary exam normal       Cardiovascular Rhythm:Regular Rate:Normal     Neuro/Psych    GI/Hepatic   Endo/Other    Renal/GU      Musculoskeletal   Abdominal   Peds  Hematology   Anesthesia Other Findings   Reproductive/Obstetrics                             Anesthesia Physical Anesthesia Plan  ASA: I  Anesthesia Plan: General   Post-op Pain Management:    Induction: Intravenous  Airway Management Planned: Oral ETT  Additional Equipment:   Intra-op Plan:   Post-operative Plan: Extubation in OR  Informed Consent: I have reviewed the patients History and Physical, chart, labs and discussed the procedure including the risks, benefits and alternatives for the proposed anesthesia with the patient or authorized representative who has indicated his/her understanding and acceptance.     Plan Discussed with: CRNA, Anesthesiologist and Surgeon  Anesthesia Plan Comments:         Anesthesia Quick Evaluation

## 2015-02-01 NOTE — Progress Notes (Signed)
Filed Vitals:   02/01/15 0144 02/01/15 0400 02/01/15 0550 02/01/15 0800  BP: 131/83  115/65   Pulse: 87  102   Temp: 98.4 F (36.9 C)  99.6 F (37.6 C)   TempSrc: Oral  Oral   Resp: 14 12 18 14   Height:      Weight:      SpO2: 100% 100% 100% 98%    Patient resting in bed, continues on PCA. Foley DC'd about 3 hours ago, no void yet. Spoke with nursing staff about checking PVR via bladder scan after patient voids. Dressing clean and dry, but about 100 mL of output into Hemovac drain over last shift.  Plan: Patient reasonably comfortable. Awaiting PT and OT, to begin to mobilize patient. Spoke with patient and his wife, who was at bedside, about beginning to transition to oral narcotic analgesics, from PCA.  Hewitt ShortsNUDELMAN,ROBERT W, MD 02/01/2015, 9:09 AM

## 2015-02-01 NOTE — Evaluation (Addendum)
Occupational Therapy Evaluation Patient Details Name: Christopher Horn MRN: [id number] DOB: 1960/11/13 Today's Date: 02/01/2015    History of Present Illness 54 y.o. s/p Lumbar five sacral one Posterior lumbar fusion with instrumentation and no interbody.   Clinical Impression   Pt s/p above. Pt requiring min assist for LB dressing, PTA. Feel pt will benefit from acute OT to increase independence prior to d/c.    Follow Up Recommendations  No OT follow up;Supervision - Intermittent    Equipment Recommendations  3 in 1 bedside comode;Other (comment) (AE)    Recommendations for Other Services       Precautions / Restrictions Precautions Precautions: Fall;Back Precaution Booklet Issued: No Precaution Comments: educated on back precautionis Required Braces or Orthoses: Spinal Brace Spinal Brace: Lumbar corset;Applied in sitting position Restrictions Weight Bearing Restrictions: No      Mobility Bed Mobility Overal bed mobility: Needs Assistance Bed Mobility: Rolling;Sidelying to Sit Rolling: Supervision Sidelying to sit: Supervision       General bed mobility comments: cues for log roll technique.  Transfers Overall transfer level: Needs assistance   Transfers: Sit to/from Stand Sit to Stand: Min guard         General transfer comment: cues for hand placement/technique.    Balance    Min guard for ambulation with RW.                                        ADL Overall ADL's : Needs assistance/impaired                 Upper Body Dressing : Sitting;Min guard;Standing (Setup/supervision-sitting/Min guard when standing)   Lower Body Dressing: Minimal assistance;With adaptive equipment;Sit to/from stand   Toilet Transfer: Min guard;Ambulation;RW (bed/chair)           Functional mobility during ADLs: Min guard;Rolling walker (Pt ambulated a few steps forward and back and then turned and sat in chair) General ADL Comments: Educated  on AE/cost/where he could purchase. Educated on back brace. Pt practiced with reacher and sockaid. Discussed recommendation for 3 in 1.     Vision     Perception     Praxis      Pertinent Vitals/Pain Pain Assessment: 0-10 Pain Score:  (2-3) Pain Location: back Pain Intervention(s): Monitored during session;Other (comment) (used PCA in session)   HR up to 130s in session and O2 dropping to 70s on oxygen (pt stated he was holding his breath). O2 trended up with deep breathing and HR trended down in session.     Hand Dominance Right   Extremity/Trunk Assessment Upper Extremity Assessment Upper Extremity Assessment: Overall WFL for tasks assessed   Lower Extremity Assessment Lower Extremity Assessment: Defer to PT evaluation       Communication Communication Communication: No difficulties   Cognition Arousal/Alertness: Awake/alert Behavior During Therapy: WFL for tasks assessed/performed Overall Cognitive Status: Within Functional Limits for tasks assessed                     General Comments       Exercises       Shoulder Instructions      Home Living Family/patient expects to be discharged to:: Private residence Living Arrangements: Spouse/significant other;Children Available Help at Discharge: Family;Available 24 hours/day Type of Home: House Home Access: Stairs to enter Enter[employer] CorporationEntrance Stairs-Number of Steps: 2 Entrance Stairs-Rails: None Home Layout: One level  Bathroom Shower/Tub: Chief Strategy OfficerTub/shower unit   Bathroom Toilet: Standard (sink close)     Home Equipment: Environmental consultantWalker - 2 wheels;Cane - quad;Cane - single point          Prior Functioning/Environment Level of Independence: Needs assistance    ADL's / Homemaking Assistance Needed: Min assist for LB dressing        OT Diagnosis: Acute pain   OT Problem List: Decreased strength;Decreased range of motion;Decreased activity tolerance;Decreased knowledge of use of DME or AE;Decreased knowledge of  precautions;Pain;Cardiopulmonary status limiting activity   OT Treatment/Interventions: Self-care/ADL training;DME and/or AE instruction;Therapeutic activities;Patient/family education;Balance training    OT Goals(Current goals can be found in the care plan section) Acute Rehab OT Goals Patient Stated Goal: get out of bed OT Goal Formulation: With patient Time For Goal Achievement: 02/08/15 Potential to Achieve Goals: Good ADL Goals Pt Will Perform Grooming: with set-up;standing Pt Will Perform Lower Body Dressing: with set-up;with adaptive equipment;sit to/from stand Pt Will Transfer to Toilet: with modified independence;ambulating (3 in 1 over commode) Pt Will Perform Toileting - Clothing Manipulation and hygiene: with modified independence;sit to/from stand Pt Will Perform Tub/Shower Transfer: Tub transfer;with supervision;ambulating;rolling walker;3 in 1 Additional ADL Goal #1: Pt will independently verbalize and maintain 3/3 back precautions during ADLs/functional activities.  OT Frequency: Min 2X/week   Barriers to D/C:            Co-evaluation              End of Session Equipment Utilized During Treatment: Gait belt;Rolling walker;Oxygen;Back brace  Activity Tolerance: Other (comment) (increased HR) Patient left: in chair;with call bell/phone within reach   Time: 1045-1105 OT Time Calculation (min): 20 min Charges:  OT General Charges $OT Visit: 1 Procedure OT Evaluation $Initial OT Evaluation Tier I: 1 Procedure G-CodesEarlie Raveling:    Ahmed Inniss L OTR/L [phone] 02/01/2015, 11:15 AM

## 2015-02-01 NOTE — Evaluation (Signed)
Physical Therapy Evaluation Christopher Horn Details Name: Christopher CapriceGregory A Horn MRN: [id number] DOB: 1961-03-25 Today's Date: 02/01/2015   History of Present Illness  54 y.o. s/p Lumbar five sacral one Posterior lumbar fusion with instrumentation and no interbody.  Clinical Impression  Christopher Horn with variable oxygen saturation and HR during mobility (lowest 85% on RA, 97% highest, HR 105-125 bpm).  Christopher Horn unable to urinate during therapy session although transferred to bathroom to try.  Christopher Horn may benefit from skilled PT while on this venue of care to improve mobility, reduce fall risk and prepare for safe return home.  Christopher Horn reports Christopher Horn will have extended family assist upon d/c home.    Follow Up Recommendations Home health PT;Supervision/Assistance - 24 hour    Equipment Recommendations  3in1 (PT)    Recommendations for Other Services       Precautions / Restrictions Precautions Precautions: Fall;Back Precaution Booklet Issued: Yes (comment) Precaution Comments: educated on back precautionis with return demo understanding Required Braces or Orthoses: Spinal Brace Spinal Brace: Lumbar corset (on upon standing) Restrictions Weight Bearing Restrictions: No Other Position/Activity Restrictions: PCA      Mobility  Bed Mobility Overal bed mobility: Needs Assistance Bed Mobility: Rolling;Sidelying to Sit Rolling: Supervision Sidelying to sit: Supervision       General bed mobility comments: Christopher Horn sitting in chair upon arrival; bed mobility not assessed  Transfers Overall transfer level: Needs assistance Equipment used: Rolling walker (2 wheeled) Transfers: Sit to/from Stand Sit to Stand: Min guard         General transfer comment: cues for hand placement/technique.  Ambulation/Gait Ambulation/Gait assistance: Min guard Ambulation Distance (Feet): 200 Feet Assistive device: Rolling walker (2 wheeled) Gait Pattern/deviations: Decreased step length - right;Decreased step length -  left;Trunk flexed (cues for upright posture)        Stairs            Wheelchair Mobility    Modified Rankin (Stroke Patients Only)       Balance Overall balance assessment: Needs assistance Sitting-balance support: No upper extremity supported;Feet supported Sitting balance-Leahy Scale: Good     Standing balance support: Bilateral upper extremity supported Standing balance-Leahy Scale: Fair Standing balance comment: reliant on UE support                             Pertinent Vitals/Pain Pain Assessment: 0-10 Pain Score: 2  Pain Location: back at surgical site Pain Descriptors / Indicators: Sore;Aching Pain Intervention(s): Limited activity within Christopher Horn's tolerance;Monitored during session;PCA encouraged    Home Living Family/Christopher Horn expects to be discharged to:: Private residence Living Arrangements: Spouse/significant other;Children Available Help at Discharge: Family;Available 24 hours/day Type of Home: House Home Access: Stairs to enter Entrance Stairs-Rails: None Entrance Stairs-Number of Steps: 2 Home Layout: One level Home Equipment: Walker - 2 wheels;Cane - quad;Cane - single point      Prior Function Level of Independence: Independent      ADL's / Homemaking Assistance Needed: Min assist for LB dressing  Comments: works full time as Soil scientistGTA mechanic, wants to return to same job     Higher education careers adviserHand Dominance   Dominant Hand: Right    Extremity/Trunk Assessment   Upper Extremity Assessment: Overall WFL for tasks assessed           Lower Extremity Assessment: Overall WFL for tasks assessed      Cervical / Trunk Assessment: Normal  Communication   Communication: No difficulties  Cognition Arousal/Alertness: Awake/alert Behavior During Therapy: [facility]aWFL  for tasks assessed/performed Overall Cognitive Status: Within Functional Limits for tasks assessed                      General Comments      Exercises         Assessment/Plan    PT Assessment Christopher Horn needs continued PT services  PT Diagnosis Difficulty walking;Generalized weakness;Acute pain   PT Problem List Decreased strength;Decreased activity tolerance;Decreased balance;Decreased mobility;Decreased knowledge of use of DME;Decreased safety awareness;Decreased knowledge of precautions;Pain  PT Treatment Interventions DME instruction;Gait training;Stair training;Functional mobility training;Therapeutic activities;Therapeutic exercise;Balance training;Neuromuscular re-education;Christopher Horn/family education   PT Goals (Current goals can be found in the Care Plan section) Acute Rehab PT Goals Christopher Horn Stated Goal: get better PT Goal Formulation: With Christopher Horn Time For Goal Achievement: 02/08/15 Potential to Achieve Goals: Good    Frequency Min 5X/week   Barriers to discharge        Co-evaluation               End of Session Equipment Utilized During Treatment: Gait belt;Back brace Activity Tolerance: Christopher Horn tolerated treatment well Christopher Horn left: in bed;with call bell/phone within reach;with family/visitor present (sitting EOB to eat lunch) Nurse Communication: Mobility status;Precautions         Time: [id number] PT Time Calculation (min) (ACUTE ONLY): 38 min   Charges:   PT Evaluation $Initial PT Evaluation Tier I: 1 Procedure PT Treatments $Gait Training: 8-22 mins   PT G CodesNestor Lewandowsky:       Megin Consalvo M Erricka Falkner, PT [phone]  Shourya Macpherson 02/01/2015, 1:21 PM

## 2015-02-02 MED ORDER — MORPHINE SULFATE 2 MG/ML IJ SOLN
2.0000 mg | INTRAMUSCULAR | Status: DC | PRN
  Administered 2015-02-02: 4 mg via INTRAVENOUS
  Administered 2015-02-03: 2 mg via INTRAVENOUS
  Filled 2015-02-02: qty 2
  Filled 2015-02-02: qty 1

## 2015-02-02 MED ORDER — PROMETHAZINE HCL 25 MG PO TABS
25.0000 mg | ORAL_TABLET | Freq: Four times a day (QID) | ORAL | Status: DC | PRN
Start: 2015-02-02 — End: 2015-02-04
  Administered 2015-02-02: 25 mg via ORAL
  Filled 2015-02-02: qty 1

## 2015-02-02 NOTE — Progress Notes (Signed)
Patient ID: Christopher Horn, male   DOB: 01-20-1961, 54 y.o.   MRN: [id number] Subjective:  The patient is alert and pleasant. He is in no apparent distress. He complains of nausea.  Objective: Vital signs in last 24 hours: Temp:  [98 F (36.7 C)-100.8 F (38.2 C)] 98.4 F (36.9 C) (05/08 0515) Pulse Rate:  [72-105] 85 (05/08 0515) Resp:  [10-17] 16 (05/08 0515) BP: (114-135)/(56-73) 115/66 mmHg (05/08 0515) SpO2:  [93 %-100 %] 100 % (05/08 0515)  Intake/Output from previous day: 05/07 0701 - 05/08 0700 In: 598 [P.O.:598] Out: 196 [Urine:125] Intake/Output this shift:    Physical exam the patient is alert and oriented. His strength is grossly normal on his lower extremities. His dressing has a normal blood stain on it.  Lab Results: No results for input(s): WBC, HGB, HCT, PLT in the last 72 hours. BMET No results for input(s): NA, K, CL, CO2, GLUCOSE, BUN, CREATININE, CALCIUM in the last 72 hours.  Studies/Results: Dg Lumbar Spine 2-3 Views  01/31/2015   CLINICAL DATA:  L5-S1 PLIF  EXAM: LUMBAR SPINE - 2-3 VIEW  COMPARISON:  Portable cross-table lateral views intraoperatively are compared to MRI lumbar spine of 01/06/2015  FINDINGS: Prior MRI labeled with 5 lumbar vertebra.  First image at 1055 hours: Metallic probe via dorsal approach projects dorsal to the mid L4 vertebral body. Surgical sponge present dorsal to lower lumbar spine from inferior L4 to superior S1. Additional metallic instrument is seen with the curve tip projecting over the spinous process of L4. Grade 1 vs 2 anterolisthesis L5-S1.  Second image at 1058 hours: Metallic probe via dorsal approach projects dorsal to the S1 segment of the sacrum. Surgical sponge unchanged. A curb metallic instrument identified with tips overlying the spinous processes of L4 and L5.  Third image at 1100 hours: Metallic probe via dorsal approach projects dorsal to the S1 segment of the sacrum. Curve metallic instrument projects over the  spinous process of L5. Surgical sponge unchanged.  IMPRESSION: Intraoperative localization images as above.   Electronically Signed   By: Ulyses SouthwardMark  Boles M.D.   On: 01/31/2015 13:10   Dg Lumbar Spine 2-3 Views  01/31/2015   CLINICAL DATA:  Lumbar fusion surgery  EXAM: LUMBAR SPINE - 2-3 VIEW; DG C-ARM 61-120 MIN  COMPARISON:  MR 01/06/2015  FINDINGS: Four intraoperative fluoroscopic spot images document bilateral pedicle screw placement L5 and S1.  IMPRESSION: 1. Posterior  spinal fixation hardware placement L5-S1.   Electronically Signed   By: Corlis Leak  Hassell M.D.   On: 01/31/2015 12:51   Dg C-arm 1-60 Min  01/31/2015   CLINICAL DATA:  Lumbar fusion surgery  EXAM: LUMBAR SPINE - 2-3 VIEW; DG C-ARM 61-120 MIN  COMPARISON:  MR 01/06/2015  FINDINGS: Four intraoperative fluoroscopic spot images document bilateral pedicle screw placement L5 and S1.  IMPRESSION: 1. Posterior  spinal fixation hardware placement L5-S1.   Electronically Signed   By: Corlis Leak  Hassell M.D.   On: 01/31/2015 12:51    Assessment/Plan: Postop day #2: The patient is progressing. We will change his dressing and he see his PCA pump/hep lock his IV. He may be able to go home tomorrow. I have answered all the patient's, and his wife's, questions.  LOS: 2 days     Christopher Horn D 02/02/2015, 7:37 AM

## 2015-02-02 NOTE — Progress Notes (Signed)
Physical Therapy Treatment Patient Details Name: Christopher Horn MRN: [id number] DOB: [birth year] Today's Date: 02/02/2015    History of Present Illness 54 y.o. s/p Lumbar five sacral one Posterior lumbar fusion with instrumentation and no interbody.    PT Comments    Patient progressing with mobility.  Do not feel needs follow up HHPT at this time; however, discussed with pt and wife may need referral from surgeon when medically ready for outpatient PT for core strength and lifting instruction for injury prevention due to pt's line of work.    Follow Up Recommendations  No PT follow up     Equipment Recommendations  3in1 (PT)    Recommendations for Other Services       Precautions / Restrictions Precautions Precautions: Fall;Back Precaution Booklet Issued:  (pt has one in room) Precaution Comments: pt with poor recall of back precautions; reviewed Required Braces or Orthoses: Spinal Brace Spinal Brace: Lumbar corset;Applied in sitting position Restrictions Weight Bearing Restrictions: No Other Position/Activity Restrictions: assist to don brace    Mobility  Bed Mobility Overal bed mobility: Modified Independent (used rail; rolling and sidelying to sitting) Bed Mobility: Sit to Sidelying         Sit to sidelying: Modified independent (Device/Increase time) General bed mobility comments: used rail to lower to bed, rolling to back min cues for precautions  Transfers Overall transfer level: Needs assistance Equipment used: Rolling walker (2 wheeled) Transfers: Sit to/from Stand Sit to Stand: Supervision         General transfer comment: for safety/precautions; educated in car transfer technique  Ambulation/Gait Ambulation/Gait assistance: Supervision Ambulation Distance (Feet): 250 Feet Assistive device: Rolling walker (2 wheeled) Gait Pattern/deviations: Step-through pattern;Decreased stride length     General Gait Details: cues for upright posture, adjusted  walker for pt's height and educated wife how to adjust the walker they have; pt leaning over to flush toilet; cue to stop due to bending at waist   Stairs            Wheelchair Mobility    Modified Rankin (Stroke Patients Only)       Balance             Standing balance-Leahy Scale: Fair Standing balance comment: static standing without UE support to adjust walker and wash hands.  walker for ambulation                    Cognition Arousal/Alertness: Awake/alert Behavior During Therapy: WFL for tasks assessed/performed Overall Cognitive Status: Within Functional Limits for tasks assessed                      Exercises      General Comments General comments (skin integrity, edema, etc.): verbally educated in stair technique with wife/pt for hand hold assist on two steps      Pertinent Vitals/Pain Pain Assessment: 0-10 Pain Score: 2  Pain Location: back Pain Descriptors / Indicators: Operative site guarding Pain Intervention(s): Monitored during session    Home Living                      Prior Function            PT Goals (current goals can now be found in the care plan section) Acute Rehab PT Goals Patient Stated Goal: not stated Progress towards PT goals: Progressing toward goals    Frequency  Min 5X/week    PT Plan Discharge plan needs to be  updated    Co-evaluation             End of Session Equipment Utilized During Treatment: Back brace Activity Tolerance: Patient tolerated treatment well Patient left: in bed;with call bell/phone within reach;with family/visitor present     Time: [id number] PT Time Calculation (min) (ACUTE ONLY): 22 min  Charges:  $Gait Training: 8-22 mins                    G Codes:      Dashiel Bergquist,CYNDI 02/02/2015, 1:25 PM  Sheran Lawlessyndi Azim [place], PT [phone] 02/02/2015

## 2015-02-02 NOTE — Progress Notes (Addendum)
Occupational Therapy Treatment Patient Details Name: Christopher Horn MRN: [id number] DOB: 06-Nov-1960 Today's Date: 02/02/2015    History of present illness 54 y.o. s/p Lumbar five sacral one Posterior lumbar fusion with instrumentation and no interbody.   OT comments  Education provided in session with wife present. Plan to practice tub transfer next session and determine DME for tub.   Follow Up Recommendations  No OT follow up;Supervision - Intermittent    Equipment Recommendations  3 in 1 bedside comode;Other (comment) (AE)    Recommendations for Other Services      Precautions / Restrictions Precautions Precautions: Fall;Back Precaution Booklet Issued:  (pt has one in room) Precaution Comments: pt with poor recall of back precautions; reviewed Required Braces or Orthoses: Spinal Brace Spinal Brace: Lumbar corset;Applied in sitting position Restrictions Weight Bearing Restrictions: No       Mobility Bed Mobility Overal bed mobility: Modified Independent (used rail; rolling and sidelying to sitting)                Transfers Overall transfer level: Needs assistance Equipment used: Rolling walker (2 wheeled) Transfers: Sit to/from Stand Sit to Stand: Supervision         General transfer comment: cues to control descent when going to sit.    Balance  Min guard for ambulation with RW.                                 ADL Overall ADL's : Needs assistance/impaired     Grooming: Oral care;Standing;Supervision/safety;Set up Grooming Details (indicate cue type and reason): tactile and verbal cues to stand upright         Upper Body Dressing : Minimal assistance;Sitting;Standing (back brace) Lower Body Dressing: Min guard;Sit to/from stand;With adaptive equipment Lower Body Dressing Details (indicate cue type and reason): wife helped adjust sock but feel pt would be able to do this. Pt donned underwear and sock with AE and doffed one sock without  AE-cues for back precaution.  Toilet Transfer: Min guard;Ambulation;RW (chair)       Tub/ Shower Transfer:  (see ADL section)   Functional mobility during ADLs: Min guard;Rolling walker-ambulated some in hallway General ADL Comments: Cues for precautions in session including to stand upright at sink and with ambulation. Discussed incorporating precautions into functional activites (use of cup for oral care and placement of grooming items). Educated on AE/cost and pt practiced with this. Discussed back brace.  Educated on safety such as safe footwear, use of bag on walker, and recommended someone be with him for tub transfer. Educated on safe tub transfer technique and pt attempted to step over simulated tub, but could not clear surface, and wife reports their tub is higher than the one he was attempting to step over. Educated on alternative tub transfer techniques and options for shower chair.  Discussed 3 in 1.  Discussed positioning of pillows.      Vision                     Perception     Praxis      Cognition  Awake/Alert Behavior During Therapy: WFL for tasks assessed/performed; flat affect Overall Cognitive Status: Within Functional Limits for tasks assessed  Memory: decreased short term memory; decreased recall of precautions                       Extremity/Trunk Assessment  Exercises     Shoulder Instructions       General Comments      Pertinent Vitals/ Pain       Pain Assessment: 0-10 Pain Score: 3  Pain Location: back Pain Descriptors / Indicators: Sore Pain Intervention(s): Monitored during session;Repositioned  Home Living                                          Prior Functioning/Environment              Frequency Min 2X/week     Progress Toward Goals  OT Goals(current goals can now be found in the care plan section)  Progress towards OT goals: Progressing toward goals  Acute Rehab OT  Goals Patient Stated Goal: not stated OT Goal Formulation: With patient Time For Goal Achievement: 02/08/15 Potential to Achieve Goals: Good ADL Goals Pt Will Perform Grooming: with set-up;standing Pt Will Perform Lower Body Dressing: with set-up;with adaptive equipment;sit to/from stand Pt Will Transfer to Toilet: with modified independence;ambulating (3 in 1 over commode) Pt Will Perform Toileting - Clothing Manipulation and hygiene: with modified independence;sit to/from stand Pt Will Perform Tub/Shower Transfer: Tub transfer;with supervision;ambulating;rolling walker;3 in 1 Additional ADL Goal #1: Pt will independently verbalize and maintain 3/3 back precautions during ADLs/functional activities.  Plan Discharge plan remains appropriate    Co-evaluation                 End of Session Equipment Utilized During Treatment: Gait belt;Rolling walker;Back brace   Activity Tolerance Patient tolerated treatment well   Patient Left in chair;with call bell/phone within reach;with family/visitor present   Nurse Communication          Time: [id number] OT Time Calculation (min): 32 min  Charges: OT General Charges $OT Visit: 1 Procedure OT Treatments $Self Care/Home Management : 23-37 mins  Christopher Horn, Christopher Horn OTR/Horn [phone] 02/02/2015, 12:00 PM

## 2015-02-03 MED ORDER — BISACODYL 5 MG PO TBEC: 5.0000 mg | DELAYED_RELEASE_TABLET | Freq: Every day | ORAL | Status: DC | PRN

## 2015-02-03 MED ORDER — FLEET ENEMA 7-19 GM/118ML RE ENEM: 1.0000 | ENEMA | Freq: Every day | RECTAL | Status: DC | PRN

## 2015-02-03 NOTE — Progress Notes (Signed)
Physical Therapy Treatment Patient Details Name: Christopher Horn MRN: [id number] DOB: [birth year] Today's Date: 02/03/2015    History of Present Illness 54 y.o. s/p Lumbar five sacral one Posterior lumbar fusion with instrumentation and no interbody.    PT Comments    Pt progressing slowly. Pt with difficulty on stair negotiation requiring increased assist however able to complete with use of RW. Pt quickly fatigues. Will complete stair negotiation again tomorrow.  Follow Up Recommendations  No PT follow up     Equipment Recommendations  3in1 (PT)    Recommendations for Other Services       Precautions / Restrictions Precautions Precautions: Fall;Back Precaution Booklet Issued: Yes (comment) Precaution Comments: pt able to recall all precautions Required Braces or Orthoses: Spinal Brace Spinal Brace: Lumbar corset;Applied in sitting position (instructed on how to pull strings simultaneously) Restrictions Weight Bearing Restrictions: No    Mobility  Bed Mobility Overal bed mobility: Modified Independent Bed Mobility: Rolling;Sidelying to Sit Rolling: Modified independent (Device/Increase time) Sidelying to sit: Modified independent (Device/Increase time)       General bed mobility comments: increased time, pt able to complete transfer without bed rail  Transfers Overall transfer level: Needs assistance Equipment used: Rolling walker (2 wheeled) Transfers: Sit to/from Stand Sit to Stand: Supervision         General transfer comment: pt with good technique, increased time  Ambulation/Gait Ambulation/Gait assistance: Supervision Ambulation Distance (Feet): 200 Feet Assistive device: Rolling walker (2 wheeled) Gait Pattern/deviations: Step-through pattern;Decreased stride length Gait velocity: decreased   General Gait Details: decreased, guarded   Stairs Stairs: Yes Stairs assistance: Mod assist Stair Management: No rails;Backwards;Forwards;With  walker Number of Stairs: 2 General stair comments: attempted ascending with PT providing R HHA however pt unable to descend without grabbing handrail. pt then completed step backwards with RW with minA from PT to hold walker and pt able to complete. wife present wtih verbal confirmation.  Wheelchair Mobility    Modified Rankin (Stroke Patients Only)       Balance                                    Cognition Arousal/Alertness: Awake/alert Behavior During Therapy: WFL for tasks assessed/performed Overall Cognitive Status: Within Functional Limits for tasks assessed                      Exercises      General Comments        Pertinent Vitals/Pain Pain Assessment: 0-10 Pain Score: 1  (3 s/p PT session) Pain Location: back Pain Descriptors / Indicators: Operative site guarding Pain Intervention(s): Monitored during session    Home Living                      Prior Function            PT Goals (current goals can now be found in the care plan section) Acute Rehab PT Goals Patient Stated Goal: not stated Progress towards PT goals: Progressing toward goals    Frequency  Min 5X/week    PT Plan Discharge plan needs to be updated    Co-evaluation             End of Session Equipment Utilized During Treatment: Back brace Activity Tolerance: Patient tolerated treatment well Patient left: in chair;with call bell/phone within reach     Time: 0925-0950 PT Time Calculation (  min) (ACUTE ONLY): 25 min  Charges:  $Gait Training: 23-37 mins                    G Codes:      Christopher Horn, Christopher Horn 02/03/2015, 10:55 AM   Christopher Horn, PT, DPT Pager #: [phone] Office #: [phone]

## 2015-02-03 NOTE — Op Note (Signed)
NAMKathryne Horn:  Christopher Horn, Christopher Horn               ACCOUNT NO.:  [id number]  MEDICAL RECORD NO.:  [id number]  LOCATION:                               FACILITY:  [facility]  PHYSICIAN:  Hilda LiasErnesto Aylanie Cubillos, M.D.   DATE OF BIRTH:  08/29/1961  DATE OF PROCEDURE:  01/31/2015 DATE OF DISCHARGE:  01/31/2015                              OPERATIVE REPORT   PREOPERATIVE DIAGNOSIS:  L5-S1 grade 2 spondylolisthesis with chronic and acute radiculopathy.  POSTOPERATIVE DIAGNOSIS:  L5-S1 grade 2 spondylolisthesis with chronic and acute radiculopathy.  PROCEDURE: 1. Bilateral L5 laminectomy and facetectomy. 2. Lysis of adhesions. 3. Decompression of the thecal sac. 4. Foraminotomy to decompress the L5-S1 nerve root bilaterally.     Attempt to introduce a cage, we were unable to do it because of     severe narrow at the space.  Pedicle screws L5-S1, posterolateral     arthrodesis with autograft and BMP.  Cell Saver.  SURGEON:  Hilda LiasErnesto Janann Boeve, M.D.  ASSISTANT:  Dr. Lovell SheehanJenkins.  CLINICAL HISTORY:  Christopher Horn is a gentleman, who was in my office complaining of back pain at rest to both legs which is getting worse. Also some neck pain radiating to the left upper extremity.  The patient had an MRI which showed grade 2 spondylolisthesis at L5-S1.  The patient wanted to proceed with surgery because he was unable to deal with the pain.  DESCRIPTION OF PROCEDURE:  The patient was taken to the OR, and after intubation, he was positioned in a prone manner.  The back was cleaned with Betadine and later on with DuraPrep.  Drapes were applied.  Midline incision from L4-L5 to L5-S1 was made and muscle retracted all the way laterally.  X-ray was taken, and indeed we were right at the level of L5- S1.  We proceeded with removal of the spinous process of L5, the lamina, and the facet which were quite loose.  The patient had quite a bit of fibrosis.  The foramen for the S1 nerve root were opened, but the one for L5 was quite narrow.  With  the drill and the 1, 2, and 3 mm Kerrison punch, we did foraminotomy to be able to make the foramen quite open for the nerve root.  We attempted to get into the disk space first from the left side and then from the right side, it was quite narrow, and we were unable to introduce the cages.  Then using the C-arm first in AP view and then a lateral view, we made holes in the pedicles at L5-S1.  Prior to introducing the screws, we feel the hole yet to be sure were surrounded by bone.  Two screws of 6.5 x 50 at the level of S1 and two 6.5 x 45 were introduced at the level of L5.  There were connected with rods and caps.  Cross link from right to left was done.  Then we went laterally, and we drilled the lateral aspect of the facet of L5 and the proximal transverse process as well at the ala of the sacrum.  A mix of BMP and autograft was used for arthrodesis.  The area was irrigated.  Valsalva maneuver was  negative.  Vancomycin was left in the operative site and a small drain.  Then, the wound was closed with Vicryl and Steri-Strips.          ______________________________ ErnestHilda Liaso Ariannie Penaloza, M.D.     EB/MEDQ  D:  01/31/2015  T:  02/01/2015  Job:  [id number]

## 2015-02-03 NOTE — Progress Notes (Signed)
Occupational Therapy Treatment Patient Details Name: Christopher Horn MRN: [id number] DOB: 1961/05/22 Today's Date: 02/03/2015    History of present illness 54 y.o. s/p Lumbar five sacral one Posterior lumbar fusion with instrumentation and no interbody.   OT comments  Pt is at adequate level for d/c home with wife (A)ing. Pt able to complete tub transfer and bed mobility at this time.   Follow Up Recommendations  No OT follow up;Supervision - Intermittent    Equipment Recommendations  3 in 1 bedside comode;Other (comment)    Recommendations for Other Services      Precautions / Restrictions Precautions Precautions: Fall;Back Required Braces or Orthoses: Spinal Brace Spinal Brace: Lumbar corset;Applied in sitting position       Mobility Bed Mobility Overal bed mobility: Modified Independent             General bed mobility comments: sitting EOB on arrival  Transfers Overall transfer level: Needs assistance Equipment used: Rolling walker (2 wheeled) Transfers: Sit to/from Stand Sit to Stand: Supervision              Balance                                   ADL                           Toilet Transfer: Min guard;Ambulation;RW       Tub/ Shower Transfer: Tub [employer] officer, [employer] Details (indicate cue type and reason): pt completed transfer with 3n1 as seat. Wife has purchased a 3n1 for home use . Pt and wife educated on positioning and position of shower curtain to prevent flooding  bathroom Functional mobility during ADLs: Supervision/safety;Rolling walker General ADL Comments: Pt has AE kit in room and 3n1 at home now. Pt and wife without further questions      Vision                     Perception     Praxis      Cognition   Behavior During Therapy: WFL for tasks assessed/performed Overall Cognitive Status: Within Functional Limits for tasks assessed                        Extremity/Trunk Assessment               Exercises     Shoulder Instructions       General Comments      Pertinent Vitals/ Pain       Pain Assessment: 0-10 Pain Score: 1  Pain Location: back Pain Intervention(s): Monitored during session  Home Living                                          Prior Functioning/Environment              Frequency Min 2X/week     Progress Toward Goals  OT Goals(current goals can now be found in the care plan section)  Progress towards OT goals: Progressing toward goals  Acute Rehab OT Goals Patient Stated Goal: not stated OT Goal Formulation: With patient Time For Goal Achievement: 02/08/15 Potential to Achieve Goals: Good ADL Goals Pt Will Perform Grooming: with set-up;standing Pt Will Perform Lower Body Dressing:  with set-up;with adaptive equipment;sit to/from stand Pt Will Transfer to Toilet: with modified independence;ambulating Pt Will Perform Toileting - Clothing Manipulation and hygiene: with modified independence;sit to/from stand Pt Will Perform Tub/Shower Transfer: Tub transfer;with supervision;ambulating;rolling walker;3 in 1 Additional ADL Goal #1: Pt will independently verbalize and maintain 3/3 back precautions during ADLs/functional activities.  Plan Discharge plan remains appropriate    Co-evaluation                 End of Session Equipment Utilized During Treatment: Gait belt;Rolling walker;Back brace   Activity Tolerance Patient tolerated treatment well   Patient Left Other (comment) (in bathroom with wife and tech (A)ing )   Nurse Communication Mobility status;Precautions        Time: 2023-3435 OT Time Calculation (min): 18 min  Charges: OT General Charges $OT Visit: 1 Procedure OT Treatments $Self Care/Home Management : 8-22 mins  Parke Poisson B 02/03/2015, 2:14 PM  Pager: [phone]

## 2015-02-03 NOTE — Progress Notes (Signed)
Patient ID: Christopher CapriceGregory A Erker, male   DOB: June 21, 1961, 54 y.o.   MRN: [id number] Doing well, constipated, no weakness. ambulating

## 2015-02-04 NOTE — Progress Notes (Signed)
Physical Therapy Treatment Patient Details Name: Christopher CapriceGregory A Horn MRN: [id number] DOB: 1961/04/16 Today's Date: 02/04/2015    History of Present Illness 54 y.o. s/p Lumbar five sacral one Posterior lumbar fusion with instrumentation and no interbody.    PT Comments    Pt con't to be limited by pain and nausea and not progressing as well as anticipated. Would benefit from home health PT to progress ambulation tolerance and wean off walker.  Follow Up Recommendations  Home health PT;Supervision/Assistance - 24 hour     Equipment Recommendations       Recommendations for Other Services       Precautions / Restrictions Precautions Precautions: Fall;Back Precaution Booklet Issued: Yes (comment) Precaution Comments: pt able to recall all precautions Required Braces or Orthoses: Spinal Brace Spinal Brace: Lumbar corset;Applied in sitting position (adjusted to optimal size, pt dem'd don/doffing indep) Restrictions Weight Bearing Restrictions: No    Mobility  Bed Mobility Overal bed mobility: Modified Independent Bed Mobility: Rolling;Sidelying to Sit;Sit to Sidelying Rolling: Modified independent (Device/Increase time) Sidelying to sit: Modified independent (Device/Increase time)     Sit to sidelying: Modified independent (Device/Increase time) General bed mobility comments: pt able to complete without v/c's or use of bedrail  Transfers Overall transfer level: Needs assistance Equipment used: Rolling walker (2 wheeled) Transfers: Sit to/from Stand Sit to Stand: Supervision         General transfer comment: pt requiring increased time due to pain and nausea,  Ambulation/Gait Ambulation/Gait assistance: Supervision Ambulation Distance (Feet): 200 Feet Assistive device: Rolling walker (2 wheeled) Gait Pattern/deviations: Step-through pattern;Decreased stride length;Trunk flexed Gait velocity: decreased Gait velocity interpretation: Below normal speed for  age/gender General Gait Details: decreased, guarded, v/c's to stay in walker   Stairs Stairs: Yes Stairs assistance: Min assist Stair Management: No rails;Step to pattern (1 person assist) Number of Stairs: 2 (x2) General stair comments: wife completed set of stairs and was able to return demo appropriate technique assisting patient safely  Wheelchair Mobility    Modified Rankin (Stroke Patients Only)       Balance                                    Cognition Arousal/Alertness: Awake/alert Behavior During Therapy: WFL for tasks assessed/performed Overall Cognitive Status: Within Functional Limits for tasks assessed                      Exercises      General Comments        Pertinent Vitals/Pain Pain Assessment: 0-10 Pain Score: 2  (3 s/p PT session) Pain Location: back Pain Descriptors / Indicators: Operative site guarding Pain Intervention(s): Monitored during session    Home Living                      Prior Function            PT Goals (current goals can now be found in the care plan section) Progress towards PT goals: Progressing toward goals    Frequency  Min 5X/week    PT Plan Discharge plan needs to be updated    Co-evaluation             End of Session Equipment Utilized During Treatment: Back brace Activity Tolerance: Patient tolerated treatment well Patient left: in bed;with call bell/phone within reach;with family/visitor present     Time: [id number] PT Time  Calculation (min) (ACUTE ONLY): 21 min  Charges:  $Gait Training: 8-22 mins                    G Codes:      Marcene BrawnChadwell, Zanovia Rotz Marie 02/04/2015, 11:09 AM   Lewis ShockAshly Jobeth Pangilinan, PT, DPT Pager #: [phone] Office #: [phone]

## 2015-02-04 NOTE — Progress Notes (Signed)
Patient's wife requested for writer to tell MD that the patient needed a prescription for nausea medication. MD called to notify.

## 2015-02-04 NOTE — Progress Notes (Signed)
MD paged regarding PT's recommendation for home health PT, MD said patient does not need PT.

## 2015-02-04 NOTE — Discharge Summary (Signed)
Physician Discharge Summary  Patient ID: Oretha CapriceGregory A Sizer MRN: [id number] DOB/AGE: 56-Jun-1962 56 y.o.  Admit date: 01/31/2015 Discharge date: 02/04/2015  Admission Diagnoses:lumbar spondylolisthesis  Discharge Diagnoses:  Active Problems:   Spondylolisthesis of lumbar region   Discharged Condition: no pain  Hospital Course: surgery  Consults: none  Significant Diagnostic Studies: mri  Treatments: lumbar fusion  Discharge Exam: Blood pressure 131/65, pulse 96, temperature 98.4 F (36.9 C), temperature source Oral, resp. rate 18, height 6' (1.829 m), weight 87.091 kg (192 lb), SpO2 98 %. Ambulating, no weakness  Disposition: home     Medication List    ASK your doctor about these medications        oseltamivir 75 MG capsule  Commonly known as:  TAMIFLU  Take 1 capsule (75 mg total) by mouth every 12 (twelve) hours.     oxyCODONE 15 MG immediate release tablet  Commonly known as:  ROXICODONE  Take 15 mg by mouth every 4 (four) hours as needed. Post-surgical pain     predniSONE 10 MG tablet  Commonly known as:  DELTASONE  Take 2 tablets (20 mg total) by mouth daily.     traMADol 50 MG tablet  Commonly known as:  ULTRAM  Take 1 tablet by mouth every 6 (six) hours.         Signed: Karn CassisBOTERO,Jossiah Smoak M 02/04/2015, 9:38 AM

## 2015-02-04 NOTE — Progress Notes (Signed)
Patient is being d/c home. D/c instruction given and patient verbalized understanding. 

## 2015-02-13 ENCOUNTER — Encounter (HOSPITAL_COMMUNITY): Payer: Self-pay | Admitting: Neurosurgery

## 2015-02-14 ENCOUNTER — Encounter (HOSPITAL_COMMUNITY): Payer: Self-pay | Admitting: Neurosurgery

## 2015-06-16 ENCOUNTER — Telehealth: Payer: Self-pay | Admitting: *Deleted

## 2015-06-16 NOTE — Telephone Encounter (Addendum)
Called and LVM for pt to reschedule appt for tomorrow due to Dr. Lucia Gaskins not in the office. Gave [facility] phone number and office hours.   Placed pt in appt of 06/19/15 for Dr. Lucia Gaskins at 0730am. If this works for him, please cancel appt for tomorrow. If not, please offer other appt times for a different day or other providers. Thank you!

## 2015-06-16 NOTE — Telephone Encounter (Signed)
Called and spoke w/ wife. She stated he has work off tomorrow and Wednesday. He cannot take appt on Thursday due to having to work. I offered a 7:30am appt time. I told her I will look at other provider schedules and see if we can fit him with another provider. She said to call his number after 3pm today to try and reach him because he gets off work at this time.

## 2015-06-17 ENCOUNTER — Ambulatory Visit: Payer: BLUE CROSS/BLUE SHIELD | Admitting: Neurology

## 2015-06-17 NOTE — Telephone Encounter (Signed)
Pt rescheduled for 10/4 at 830am.

## 2015-06-19 ENCOUNTER — Ambulatory Visit: Payer: Self-pay | Admitting: Neurology

## 2015-06-30 ENCOUNTER — Telehealth: Payer: Self-pay | Admitting: *Deleted

## 2015-06-30 NOTE — Telephone Encounter (Signed)
Called and LVM for pt to call and reschedule appt. We have to cancel for tomorrow due to Dr. Lucia Gaskins having family emergency. Gave [facility] phone number and office hours for him to call back and reschedule. Please r/s in NP slot if he calls back.

## 2015-07-01 ENCOUNTER — Ambulatory Visit: Payer: BLUE CROSS/BLUE SHIELD | Admitting: Neurology

## 2015-07-01 NOTE — Telephone Encounter (Signed)
LVM for pt or wife to call back to reschedule pt appt this morning due to Dr Lucia Gaskins having family emergency. Gave [facility] phone number.

## 2015-07-08 ENCOUNTER — Ambulatory Visit (INDEPENDENT_AMBULATORY_CARE_PROVIDER_SITE_OTHER): Payer: BLUE CROSS/BLUE SHIELD | Admitting: Neurology

## 2015-07-08 ENCOUNTER — Encounter: Payer: Self-pay | Admitting: Neurology

## 2015-07-08 VITALS — BP 117/74 | HR 71 | Ht 72.0 in | Wt 197.0 lb

## 2015-07-08 DIAGNOSIS — R42 Dizziness and giddiness: Secondary | ICD-10-CM | POA: Diagnosis not present

## 2015-07-08 DIAGNOSIS — H93A3 Pulsatile tinnitus, bilateral: Secondary | ICD-10-CM | POA: Diagnosis not present

## 2015-07-08 DIAGNOSIS — H9193 Unspecified hearing loss, bilateral: Secondary | ICD-10-CM

## 2015-07-08 DIAGNOSIS — R2689 Other abnormalities of gait and mobility: Secondary | ICD-10-CM

## 2015-07-08 MED ORDER — ALPRAZOLAM 1 MG PO TABS
1.0000 mg | ORAL_TABLET | Freq: Every evening | ORAL | Status: DC | PRN
Start: 2015-07-08 — End: 2015-07-08

## 2015-07-08 MED ORDER — ALPRAZOLAM 1 MG PO TABS: ORAL_TABLET | ORAL | Status: AC

## 2015-07-08 NOTE — Progress Notes (Signed)
[facility]    Provider:  Dr Lucia Gaskins Referring Provider: Gretel Acre, MD Primary Care Physician:  No primary care provider on file.  CC:  Sound movement inside my head  HPI:  Christopher Horn is a 54 y.o. male here as a referral from Dr. Ihor Dow for Tinnitus. This started 35 years ago. He has hearing loss. No inciting events. The ringing is getting louder. There is chirping sound. +dizziness, +imbalance. +Room spinning. No vision changes, no speech changes. He feels like there is a movement in his head. He can hear beating in the center of his head. Gets worse with activity. He feels a sound and a movement. He feels something moving in his head. If he is laying in bed and he raises up he can hear it in his head. +Head spinning. He had back surgery and it worsened. It really bothers him. He feels confused when it happens, lightheaded, imbalance. No other focal neurologic deficits. No headache, no vision changes.   Reviewed notes, labs and imaging from outside physicians, which showed: he saw dr Joneen Roach whose ENT exam revealed normal tympanic membranes, normal sinuses and larynx. Neck free of any thyromegaly, cervical adenopathy or masses. He was not orthostatic from sitting to standing and Dr. Allayne Stack office. His audiogram shows a 40 dB speech reception threshold bilaterally with a high-frequency sensorineural hearing loss with a 92% discrimination score. 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 However the tinnitus is so severe that the vertigo and hearing loss bilaterally with the head fullness sensation and a history of head trauma.  BMP with creatinine of 0.97.  Review of Systems: Patient complains of symptoms per HPI as well as the following symptoms: rinign in ears, restless legs. Pertinent negatives per HPI. All others negative.   Social History   Social History  . Marital Status: Married    Spouse Name: N/A  .  Number of Children: 2  . Years of Education: 12   Occupational History  . [employer]     Social History Main Topics  . Smoking status: Former Smoker    Quit date: 03/28/2011  . Smokeless tobacco: Not on file  . Alcohol Use: Yes     Comment: rare  . Drug Use: No  . Sexual Activity: Not on file   Other Topics Concern  . Not on file   Social History Narrative   Lives at home with wife   Caffeine: 2 cups coffee per day       Family History  Problem Relation Age of Onset  . Heart failure Father   . Breast cancer Mother   . Bell's palsy Brother     Past Medical History  Diagnosis Date  . Pollen allergies   . Anxiety   . Asthma   . Dyslipidemia   . Spondylisthesis     Past Surgical History  Procedure Laterality Date  . Wrist surgery    . Fracture surgery    . Right index finger fracture    . Colonoscopy    . Back surgery  May 2016    Current Outpatient Prescriptions  Medication Sig Dispense Refill  . traMADol (ULTRAM) 50 MG tablet TK 1 T PO Q 6 TO 8 H PRN  0   No current facility-administered medications for this visit.    Allergies as of 07/08/2015  . (No Known Allergies)    Vitals: BP 117/74 mmHg  Pulse 71  Ht 6' (1.829 m)  Wt 197 lb (89.359 kg)  BMI 26.71 kg/m2 Last Weight:  Wt Readings from Last 1 Encounters:  07/08/15 197 lb (89.359 kg)   Last Height:   Ht Readings from Last 1 Encounters:  07/08/15 6' (1.829 m)    Physical exam: Exam: Gen: NAD, conversant                CV: RRR, no MRG. No Carotid Bruits. No peripheral edema, warm, nontender Eyes: Conjunctivae clear without exudates or hemorrhage  Neuro: Detailed Neurologic Exam  Speech:    Speech is normal; fluent and spontaneous with normal comprehension.  Cognition:    The patient is oriented to person, place, and time;     recent and remote memory intact;     language fluent;     normal attention, concentration,     fund of knowledge Cranial Nerves:     The pupils are equal, round, and reactive to light. 20/20 OS, 20/30 OD. The fundi are flat with blured disk margin r>l.  Visual fields are full to finger confrontation. Extraocular movements are intact. Trigeminal sensation is intact and the muscles of mastication are normal. The face is symmetric. The palate elevates in the midline. Hearing intact. Voice is normal. Shoulder shrug is normal. The tongue has normal motion without fasciculations.   Coordination:    Normal finger to nose and heel to shin. Normal rapid alternating movements.   Gait:    Heel-toe and tandem gait are normal.   Motor Observation:    No asymmetry, no atrophy, and no involuntary movements noted. Tone:    Normal muscle tone.    Posture:    Posture is normal. normal erect    Strength:    Strength is V/V in the upper and lower limbs.      Sensation: intact to LT     Reflex Exam:  DTR's: Absent AJs. Otherwise deep tendon reflexes in the upper and lower extremities are normal bilaterally.   Toes:    The toes are downgoing bilaterally.   Clonus:    Clonus is absent.      Assessment/Plan:  54 year old with pulsatile tinnitus, vertigo, imbalance, dizziness. Need MRi brain  to rule out central cause of vertigo or a vestibular schwannoma, cerebellar infarct and for pathologies affecting the brainstem or vestibular nerve, MRA to detect aneurysm causing the pulsatile tinnitus, stenosis or occlusion of the posterior circulation.   CC: Dr. Precious Haws, MD  [facility] [address] [place], [place] [address]  Phone [phone] Fax [phone]

## 2015-07-08 NOTE — Patient Instructions (Signed)
Remember to drink plenty of fluid, eat healthy meals and do not skip any meals. Try to eat protein with a every meal and eat a healthy snack such as fruit or nuts in between meals. Try to keep a regular sleep-wake schedule and try to exercise daily, particularly in the form of walking, 20-30 minutes a day, if you can.   As far as your medications are concerned, I would like to suggest  As far as diagnostic testing: MRi of the brain, MRA of the head  I would like to see you back after imaging, sooner if we need to. Please call us with any interim questions, concerns, problems, updates or refill requests.   Our phone number is [phone]. We also have an after hours call service for urgent matters and there is a physician on-call for urgent questions. For any emergencies you know to call 911 or go to the nearest emergency room

## 2015-07-16 ENCOUNTER — Ambulatory Visit (INDEPENDENT_AMBULATORY_CARE_PROVIDER_SITE_OTHER): Payer: Self-pay

## 2015-07-16 DIAGNOSIS — R42 Dizziness and giddiness: Secondary | ICD-10-CM

## 2015-07-16 DIAGNOSIS — H93A3 Pulsatile tinnitus, bilateral: Secondary | ICD-10-CM | POA: Diagnosis not present

## 2015-07-16 DIAGNOSIS — H9193 Unspecified hearing loss, bilateral: Secondary | ICD-10-CM | POA: Diagnosis not present

## 2015-07-16 DIAGNOSIS — Z0289 Encounter for other administrative examinations: Secondary | ICD-10-CM

## 2015-07-16 DIAGNOSIS — R2689 Other abnormalities of gait and mobility: Secondary | ICD-10-CM

## 2015-07-21 ENCOUNTER — Telehealth: Payer: Self-pay | Admitting: *Deleted

## 2015-07-21 NOTE — Telephone Encounter (Signed)
-----   Message from Antonia B Ahern, MD sent at 07/21/2015  7:47 AM EDT ----- Let patient know the MRI of his brain and MRA of his vessels are normal for age. thanks 

## 2015-07-21 NOTE — Telephone Encounter (Signed)
LVM for pt to call about results. Gave [facility] phone number and office hours. Okay to inform him MRI brain and MRA blood vessels are normal for his age.

## 2015-07-22 NOTE — Telephone Encounter (Signed)
Spoke to pt and relayed that MRI/MRA brain results normal for his age (age appropriate), no acute findings.  He verbalized understanding.   He is not signed up for my chart.

## 2015-07-22 NOTE — Telephone Encounter (Signed)
-----   Message from Anson FretAntonia B Ahern, MD sent at 07/21/2015  7:47 AM EDT ----- Let patient know the MRI of his brain and MRA of his vessels are normal for age. thanks

## 2016-04-18 IMAGING — DX DG LUMBAR SPINE 2-3V
3 series · 3 of 3 positions shown · non-contrast
Comparison: Portable cross-table lateral views intraoperatively are
compared to MRI lumbar spine of 01/06/2015

CLINICAL DATA: L5-S1 PLIF

EXAM:
LUMBAR SPINE - 2-3 VIEW

[lat (1 of 3)]
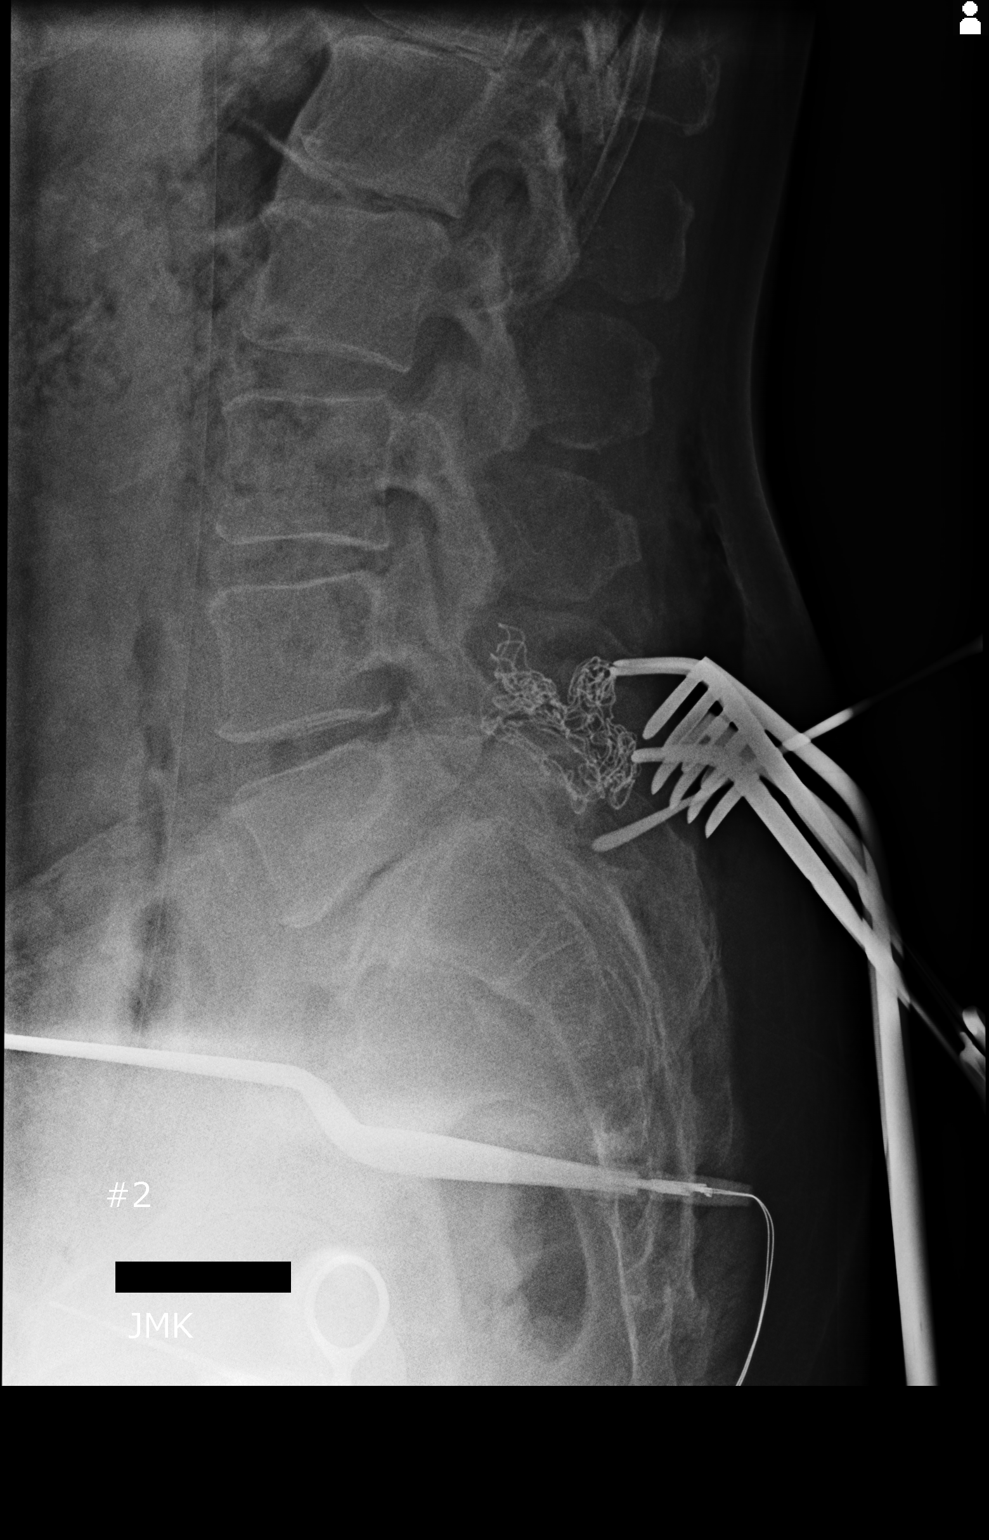

[lat (2 of 3)]
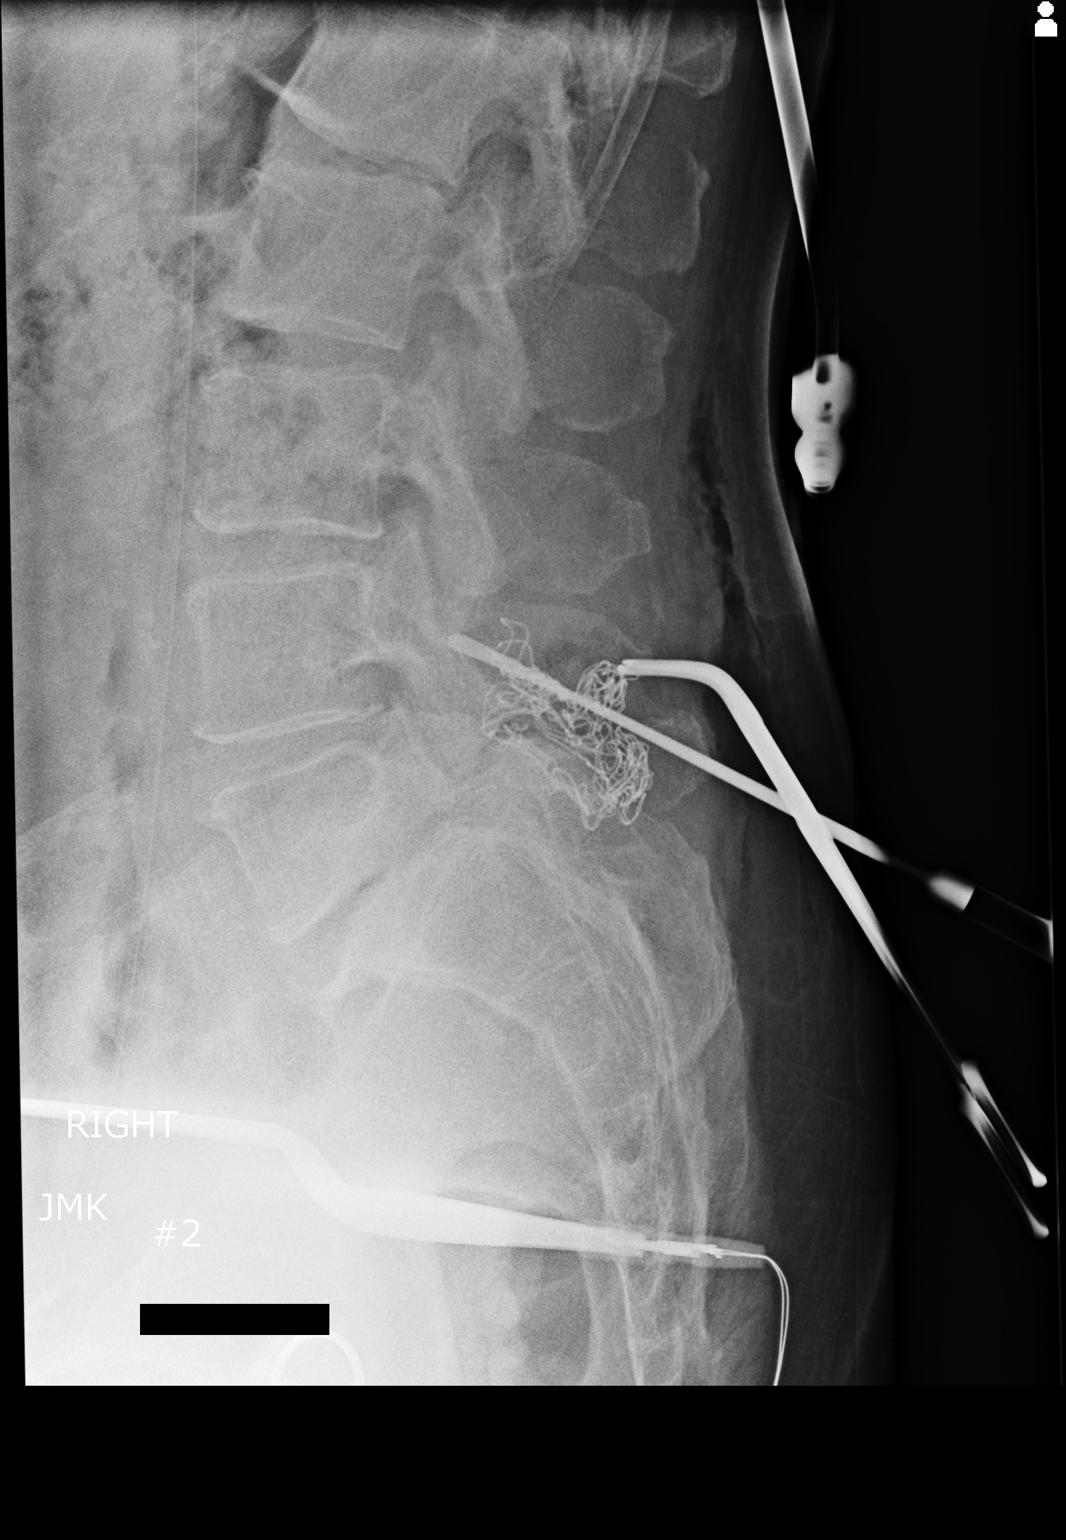

[lat (3 of 3)]
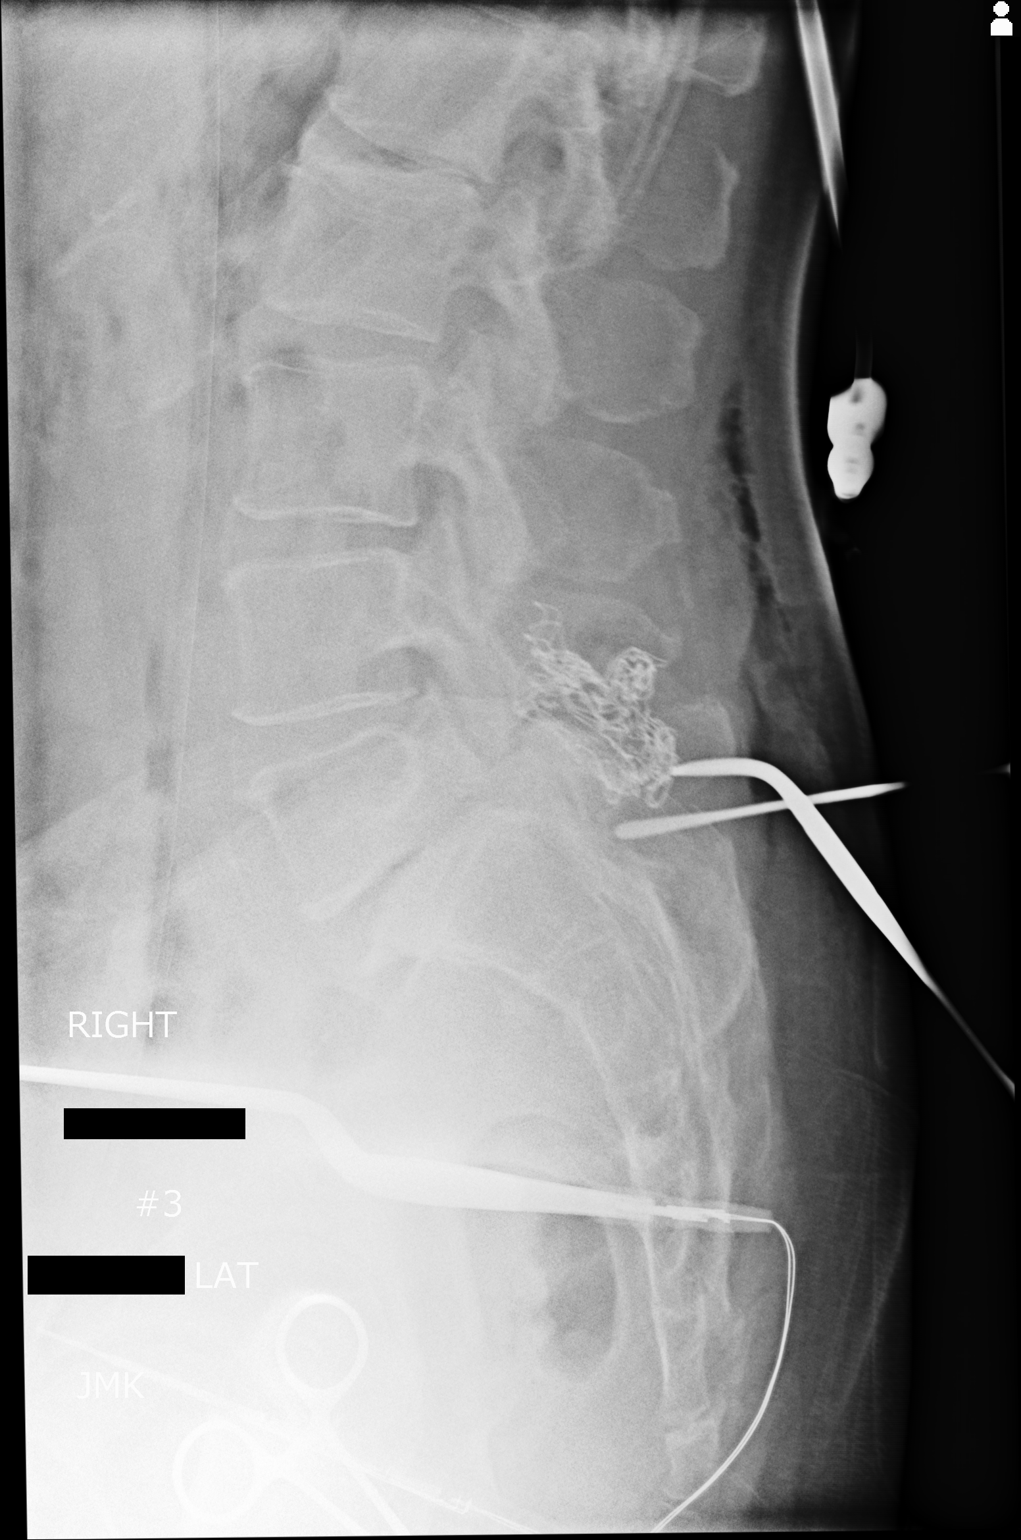

[3 of 3 positions shown; findings below may reference images not displayed]

FINDINGS: Prior MRI labeled with 5 lumbar vertebra.

First image at 6844 hours: Metallic probe via dorsal approach
projects dorsal to the mid L4 vertebral body. Surgical sponge
present dorsal to lower lumbar spine from inferior L4 to superior
S1. Additional metallic instrument is seen with the curve tip
projecting over the spinous process of L4. Grade 1 vs 2
anterolisthesis L5-S1.

Second image at 1411 hours: Metallic probe via dorsal approach
projects dorsal to the S1 segment of the sacrum. Surgical sponge
unchanged. A curb metallic instrument identified with tips overlying
the spinous processes of L4 and L5.

Third image at 1144 hours: Metallic probe via dorsal approach
projects dorsal to the S1 segment of the sacrum. Curve metallic
instrument projects over the spinous process of L5. Surgical sponge
unchanged.
IMPRESSION: Intraoperative localization images as above.

## 2017-12-12 DIAGNOSIS — G479 Sleep disorder, unspecified: Secondary | ICD-10-CM | POA: Diagnosis not present

## 2017-12-12 DIAGNOSIS — R040 Epistaxis: Secondary | ICD-10-CM | POA: Diagnosis not present

## 2017-12-14 DIAGNOSIS — R040 Epistaxis: Secondary | ICD-10-CM | POA: Diagnosis not present

## 2017-12-14 DIAGNOSIS — G4726 Circadian rhythm sleep disorder, shift work type: Secondary | ICD-10-CM | POA: Diagnosis not present

## 2017-12-28 DIAGNOSIS — R040 Epistaxis: Secondary | ICD-10-CM | POA: Diagnosis not present

## 2018-03-10 DIAGNOSIS — G4726 Circadian rhythm sleep disorder, shift work type: Secondary | ICD-10-CM | POA: Diagnosis not present

## 2020-02-09 ENCOUNTER — Ambulatory Visit: Payer: Self-pay | Attending: Internal Medicine

## 2020-02-09 DIAGNOSIS — Z23 Encounter for immunization: Secondary | ICD-10-CM

## 2020-02-09 NOTE — Progress Notes (Signed)
   Covid-19 Vaccination Clinic  Name:  Christopher Horn    MRN: [id number] DOB: [birth year]  02/09/2020  Mr. Edgett was observed post Covid-19 immunization for 15 minutes without incident. He was provided with Vaccine Information Sheet and instruction to access the V-Safe system.   Mr. Doren was instructed to call 911 with any severe reactions post vaccine: Marland Kitchen Difficulty breathing  . Swelling of face and throat  . A fast heartbeat  . A bad rash all over body  . Dizziness and weakness   Immunizations Administered    Name Date Dose VIS Date Route   Pfizer COVID-19 Vaccine 02/09/2020 10:58 AM 0.3 mL 11/21/2018 Intramuscular   Manufacturer: [employer], [employer]   Lot: [id number]   NDC: [address]-1

## 2020-03-03 ENCOUNTER — Ambulatory Visit: Payer: Self-pay | Attending: Internal Medicine

## 2020-03-03 DIAGNOSIS — Z23 Encounter for immunization: Secondary | ICD-10-CM

## 2020-03-03 NOTE — Progress Notes (Signed)
   Covid-19 Vaccination Clinic  Name:  Christopher Horn    MRN: [id number] DOB: 04-Sep-1961  03/03/2020  [name] was observed post Covid-19 immunization for 15 minutes without incident. He was provided with Vaccine Information Sheet and instruction to access the V-Safe system.   [name] was instructed to call 911 with any severe reactions post vaccine: Marland Kitchen Difficulty breathing  . Swelling of face and throat  . A fast heartbeat  . A bad rash all over body  . Dizziness and weakness   Immunizations Administered    Name Date Dose VIS Date Route   Pfizer COVID-19 Vaccine 03/03/2020 11:03 AM 0.3 mL 11/21/2018 Intramuscular   Manufacturer: [employer], [employer]   Lot: [id number]   NDC: [address]-0
